# Patient Record
Sex: Female | Born: 1955 | Race: White | Hispanic: No | Marital: Married | Smoking: Former smoker
Health system: Southern US, Community
[De-identification: ages and names within clinical notes are randomized; demographics above are authoritative.]

## PROBLEM LIST (undated history)

## (undated) DIAGNOSIS — F329 Major depressive disorder, single episode, unspecified: Secondary | ICD-10-CM

## (undated) DIAGNOSIS — T7840XA Allergy, unspecified, initial encounter: Secondary | ICD-10-CM

## (undated) DIAGNOSIS — F32A Depression, unspecified: Secondary | ICD-10-CM

## (undated) DIAGNOSIS — I10 Essential (primary) hypertension: Secondary | ICD-10-CM

## (undated) DIAGNOSIS — E785 Hyperlipidemia, unspecified: Secondary | ICD-10-CM

## (undated) HISTORY — DX: Major depressive disorder, single episode, unspecified: F32.9

## (undated) HISTORY — PX: NASAL SINUS SURGERY: SHX719

## (undated) HISTORY — PX: TONSILLECTOMY: SUR1361

## (undated) HISTORY — PX: BREAST LUMPECTOMY: SHX2

## (undated) HISTORY — DX: Essential (primary) hypertension: I10

## (undated) HISTORY — DX: Hyperlipidemia, unspecified: E78.5

## (undated) HISTORY — DX: Depression, unspecified: F32.A

## (undated) HISTORY — DX: Allergy, unspecified, initial encounter: T78.40XA

---

## 1998-05-14 ENCOUNTER — Ambulatory Visit (HOSPITAL_COMMUNITY): Admission: RE | Admit: 1998-05-14 | Discharge: 1998-05-14 | Payer: Self-pay | Admitting: Otolaryngology

## 1999-06-19 ENCOUNTER — Ambulatory Visit (HOSPITAL_COMMUNITY): Admission: RE | Admit: 1999-06-19 | Discharge: 1999-06-19 | Payer: Self-pay | Admitting: Otolaryngology

## 2002-07-06 ENCOUNTER — Other Ambulatory Visit: Admission: RE | Admit: 2002-07-06 | Discharge: 2002-07-06 | Payer: Self-pay | Admitting: Internal Medicine

## 2003-09-05 ENCOUNTER — Other Ambulatory Visit: Admission: RE | Admit: 2003-09-05 | Discharge: 2003-09-05 | Payer: Self-pay | Admitting: Internal Medicine

## 2004-09-01 ENCOUNTER — Other Ambulatory Visit: Admission: RE | Admit: 2004-09-01 | Discharge: 2004-09-01 | Payer: Self-pay | Admitting: Internal Medicine

## 2004-10-29 ENCOUNTER — Encounter: Admission: RE | Admit: 2004-10-29 | Discharge: 2004-10-29 | Payer: Self-pay | Admitting: General Surgery

## 2004-10-29 ENCOUNTER — Encounter (INDEPENDENT_AMBULATORY_CARE_PROVIDER_SITE_OTHER): Payer: Self-pay | Admitting: *Deleted

## 2004-12-18 ENCOUNTER — Ambulatory Visit (HOSPITAL_BASED_OUTPATIENT_CLINIC_OR_DEPARTMENT_OTHER): Admission: RE | Admit: 2004-12-18 | Discharge: 2004-12-18 | Payer: Self-pay | Admitting: General Surgery

## 2004-12-18 ENCOUNTER — Encounter (INDEPENDENT_AMBULATORY_CARE_PROVIDER_SITE_OTHER): Payer: Self-pay | Admitting: Specialist

## 2004-12-18 ENCOUNTER — Ambulatory Visit (HOSPITAL_COMMUNITY): Admission: RE | Admit: 2004-12-18 | Discharge: 2004-12-18 | Payer: Self-pay | Admitting: General Surgery

## 2005-01-28 ENCOUNTER — Ambulatory Visit: Payer: Self-pay | Admitting: Internal Medicine

## 2005-06-21 ENCOUNTER — Ambulatory Visit: Payer: Self-pay | Admitting: Internal Medicine

## 2005-10-19 ENCOUNTER — Ambulatory Visit: Payer: Self-pay | Admitting: Internal Medicine

## 2005-10-28 ENCOUNTER — Other Ambulatory Visit: Admission: RE | Admit: 2005-10-28 | Discharge: 2005-10-28 | Payer: Self-pay | Admitting: Neurosurgery

## 2005-10-28 ENCOUNTER — Encounter: Payer: Self-pay | Admitting: Internal Medicine

## 2005-10-28 ENCOUNTER — Ambulatory Visit: Payer: Self-pay | Admitting: Internal Medicine

## 2005-12-20 ENCOUNTER — Ambulatory Visit: Payer: Self-pay | Admitting: Internal Medicine

## 2006-01-20 ENCOUNTER — Ambulatory Visit: Payer: Self-pay | Admitting: Internal Medicine

## 2006-02-24 ENCOUNTER — Ambulatory Visit: Payer: Self-pay | Admitting: Internal Medicine

## 2006-11-15 ENCOUNTER — Ambulatory Visit: Payer: Self-pay | Admitting: Internal Medicine

## 2006-12-21 ENCOUNTER — Ambulatory Visit: Payer: Self-pay | Admitting: Internal Medicine

## 2007-02-02 ENCOUNTER — Ambulatory Visit: Payer: Self-pay | Admitting: Internal Medicine

## 2007-02-02 LAB — CONVERTED CEMR LAB
ALT: 34 units/L (ref 0–40)
AST: 25 units/L (ref 0–37)
Albumin: 3.5 g/dL (ref 3.5–5.2)
Alkaline Phosphatase: 60 units/L (ref 39–117)
BUN: 7 mg/dL (ref 6–23)
Basophils Absolute: 0 10*3/uL (ref 0.0–0.1)
Basophils Relative: 0.1 % (ref 0.0–1.0)
Bilirubin, Direct: 0.1 mg/dL (ref 0.0–0.3)
CO2: 26 meq/L (ref 19–32)
Calcium: 9.2 mg/dL (ref 8.4–10.5)
Chloride: 107 meq/L (ref 96–112)
Cholesterol: 207 mg/dL (ref 0–200)
Creatinine, Ser: 0.8 mg/dL (ref 0.4–1.2)
Direct LDL: 105.1 mg/dL
Eosinophils Absolute: 0.3 10*3/uL (ref 0.0–0.6)
Eosinophils Relative: 4.2 % (ref 0.0–5.0)
GFR calc Af Amer: 98 mL/min
GFR calc non Af Amer: 81 mL/min
Glucose, Bld: 102 mg/dL — ABNORMAL HIGH (ref 70–99)
HCT: 36.5 % (ref 36.0–46.0)
HDL: 41.6 mg/dL (ref >39.0)
Hemoglobin: 12.8 g/dL (ref 12.0–15.0)
Lymphocytes Relative: 29.8 % (ref 12.0–46.0)
MCHC: 35.2 g/dL (ref 30.0–36.0)
MCV: 92.6 fL (ref 78.0–100.0)
Monocytes Absolute: 0.4 10*3/uL (ref 0.2–0.7)
Monocytes Relative: 6.6 % (ref 3.0–11.0)
Neutro Abs: 3.9 10*3/uL (ref 1.4–7.7)
Neutrophils Relative %: 59.3 % (ref 43.0–77.0)
Platelets: 411 10*3/uL — ABNORMAL HIGH (ref 150–400)
Potassium: 4.2 meq/L (ref 3.5–5.1)
RBC: 3.94 M/uL (ref 3.87–5.11)
RDW: 11.7 % (ref 11.5–14.6)
Sodium: 141 meq/L (ref 135–145)
TSH: 1 microintl units/mL (ref 0.35–5.50)
Total Bilirubin: 0.6 mg/dL (ref 0.3–1.2)
Total CHOL/HDL Ratio: 5
Total Protein: 7.1 g/dL (ref 6.0–8.3)
Triglycerides: 339 mg/dL (ref 0–149)
VLDL: 68 mg/dL — ABNORMAL HIGH (ref 0–40)
WBC: 6.6 10*3/uL (ref 4.5–10.5)

## 2007-02-07 ENCOUNTER — Encounter: Payer: Self-pay | Admitting: Internal Medicine

## 2007-02-07 ENCOUNTER — Other Ambulatory Visit: Admission: RE | Admit: 2007-02-07 | Discharge: 2007-02-07 | Payer: Self-pay | Admitting: Internal Medicine

## 2007-02-07 ENCOUNTER — Ambulatory Visit: Payer: Self-pay | Admitting: Internal Medicine

## 2007-05-24 ENCOUNTER — Ambulatory Visit: Payer: Self-pay | Admitting: Internal Medicine

## 2007-05-24 LAB — CONVERTED CEMR LAB
Cholesterol: 236 mg/dL (ref 0–200)
HDL: 72.9 mg/dL (ref >39.0)
Total CHOL/HDL Ratio: 3.2
VLDL: 32 mg/dL (ref 0–40)

## 2007-05-31 ENCOUNTER — Ambulatory Visit: Payer: Self-pay | Admitting: Internal Medicine

## 2007-09-29 DIAGNOSIS — J309 Allergic rhinitis, unspecified: Secondary | ICD-10-CM | POA: Insufficient documentation

## 2007-09-29 DIAGNOSIS — F329 Major depressive disorder, single episode, unspecified: Secondary | ICD-10-CM

## 2007-09-29 DIAGNOSIS — E785 Hyperlipidemia, unspecified: Secondary | ICD-10-CM

## 2007-09-29 DIAGNOSIS — I10 Essential (primary) hypertension: Secondary | ICD-10-CM

## 2008-02-14 ENCOUNTER — Encounter: Payer: Self-pay | Admitting: Internal Medicine

## 2008-03-06 ENCOUNTER — Ambulatory Visit: Payer: Self-pay | Admitting: Internal Medicine

## 2008-03-06 LAB — CONVERTED CEMR LAB
ALT: 28 units/L (ref 0–35)
AST: 28 units/L (ref 0–37)
Albumin: 3.9 g/dL (ref 3.5–5.2)
Alkaline Phosphatase: 63 units/L (ref 39–117)
BUN: 12 mg/dL (ref 6–23)
Basophils Absolute: 0.1 10*3/uL (ref 0.0–0.1)
Basophils Relative: 0.9 % (ref 0.0–1.0)
Bilirubin Urine: NEGATIVE
Bilirubin, Direct: 0.1 mg/dL (ref 0.0–0.3)
CO2: 27 meq/L (ref 19–32)
Calcium: 9.2 mg/dL (ref 8.4–10.5)
Chloride: 107 meq/L (ref 96–112)
Cholesterol: 206 mg/dL (ref 0–200)
Creatinine, Ser: 0.8 mg/dL (ref 0.4–1.2)
Direct LDL: 113.2 mg/dL
Eosinophils Absolute: 0.3 10*3/uL (ref 0.0–0.7)
Eosinophils Relative: 4.5 % (ref 0.0–5.0)
GFR calc Af Amer: 97 mL/min
GFR calc non Af Amer: 80 mL/min
Glucose, Bld: 112 mg/dL — ABNORMAL HIGH (ref 70–99)
Glucose, Urine, Semiquant: NEGATIVE
HCT: 37.7 % (ref 36.0–46.0)
HDL: 77.2 mg/dL (ref >39.0)
Hemoglobin: 12.8 g/dL (ref 12.0–15.0)
Ketones, urine, test strip: NEGATIVE
Lymphocytes Relative: 24.3 % (ref 12.0–46.0)
MCHC: 34.1 g/dL (ref 30.0–36.0)
MCV: 93 fL (ref 78.0–100.0)
Monocytes Absolute: 0.5 10*3/uL (ref 0.1–1.0)
Monocytes Relative: 7.6 % (ref 3.0–12.0)
Neutro Abs: 4.2 10*3/uL (ref 1.4–7.7)
Neutrophils Relative %: 62.7 % (ref 43.0–77.0)
Nitrite: NEGATIVE
Platelets: 298 10*3/uL (ref 150–400)
Potassium: 4.3 meq/L (ref 3.5–5.1)
RBC: 4.05 M/uL (ref 3.87–5.11)
RDW: 11.9 % (ref 11.5–14.6)
Sodium: 139 meq/L (ref 135–145)
Specific Gravity, Urine: 1.015
TSH: 1.51 microintl units/mL (ref 0.35–5.50)
Total Bilirubin: 0.7 mg/dL (ref 0.3–1.2)
Total CHOL/HDL Ratio: 2.7
Total Protein: 6.8 g/dL (ref 6.0–8.3)
Triglycerides: 92 mg/dL (ref 0–149)
Urobilinogen, UA: 0.2
VLDL: 18 mg/dL (ref 0–40)
WBC Urine, dipstick: NEGATIVE
WBC: 6.8 10*3/uL (ref 4.5–10.5)
pH: 8.5

## 2008-03-18 ENCOUNTER — Ambulatory Visit: Payer: Self-pay | Admitting: Internal Medicine

## 2008-04-11 ENCOUNTER — Other Ambulatory Visit: Admission: RE | Admit: 2008-04-11 | Discharge: 2008-04-11 | Payer: Self-pay | Admitting: Internal Medicine

## 2008-04-11 ENCOUNTER — Encounter: Payer: Self-pay | Admitting: Internal Medicine

## 2008-04-11 ENCOUNTER — Ambulatory Visit: Payer: Self-pay | Admitting: Internal Medicine

## 2008-10-11 ENCOUNTER — Ambulatory Visit: Payer: Self-pay | Admitting: Internal Medicine

## 2009-01-28 ENCOUNTER — Telehealth: Payer: Self-pay | Admitting: Internal Medicine

## 2009-04-14 ENCOUNTER — Ambulatory Visit: Payer: Self-pay | Admitting: Internal Medicine

## 2009-04-14 LAB — CONVERTED CEMR LAB
ALT: 24 units/L (ref 0–35)
AST: 27 units/L (ref 0–37)
Albumin: 3.9 g/dL (ref 3.5–5.2)
Alkaline Phosphatase: 67 units/L (ref 39–117)
BUN: 13 mg/dL (ref 6–23)
Basophils Absolute: 0.1 10*3/uL (ref 0.0–0.1)
Basophils Relative: 1.5 % (ref 0.0–3.0)
Bilirubin Urine: NEGATIVE
Bilirubin, Direct: 0 mg/dL (ref 0.0–0.3)
CO2: 26 meq/L (ref 19–32)
Calcium: 8.9 mg/dL (ref 8.4–10.5)
Chloride: 111 meq/L (ref 96–112)
Cholesterol: 218 mg/dL — ABNORMAL HIGH (ref 0–200)
Creatinine, Ser: 0.8 mg/dL (ref 0.4–1.2)
Direct LDL: 146.7 mg/dL
Eosinophils Absolute: 0.4 10*3/uL (ref 0.0–0.7)
Eosinophils Relative: 6.7 % — ABNORMAL HIGH (ref 0.0–5.0)
GFR calc non Af Amer: 79.77 mL/min (ref >60)
Glucose, Bld: 112 mg/dL — ABNORMAL HIGH (ref 70–99)
Glucose, Urine, Semiquant: NEGATIVE
HCT: 37.7 % (ref 36.0–46.0)
HDL: 59.6 mg/dL (ref >39.00)
Hemoglobin: 13 g/dL (ref 12.0–15.0)
Ketones, urine, test strip: NEGATIVE
Lymphocytes Relative: 34.5 % (ref 12.0–46.0)
Lymphs Abs: 1.9 10*3/uL (ref 0.7–4.0)
MCHC: 34.6 g/dL (ref 30.0–36.0)
MCV: 94.7 fL (ref 78.0–100.0)
Monocytes Absolute: 0.3 10*3/uL (ref 0.1–1.0)
Monocytes Relative: 6.2 % (ref 3.0–12.0)
Neutro Abs: 2.8 10*3/uL (ref 1.4–7.7)
Neutrophils Relative %: 51.1 % (ref 43.0–77.0)
Nitrite: NEGATIVE
Platelets: 275 10*3/uL (ref 150.0–400.0)
Potassium: 3.9 meq/L (ref 3.5–5.1)
RBC: 3.98 M/uL (ref 3.87–5.11)
RDW: 12.1 % (ref 11.5–14.6)
Sodium: 142 meq/L (ref 135–145)
Specific Gravity, Urine: 1.015
TSH: 1.8 microintl units/mL (ref 0.35–5.50)
Total Bilirubin: 0.6 mg/dL (ref 0.3–1.2)
Total CHOL/HDL Ratio: 4
Total Protein: 7 g/dL (ref 6.0–8.3)
Triglycerides: 83 mg/dL (ref 0.0–149.0)
Urobilinogen, UA: 0.2
VLDL: 16.6 mg/dL (ref 0.0–40.0)
WBC Urine, dipstick: NEGATIVE
WBC: 5.5 10*3/uL (ref 4.5–10.5)
pH: 7

## 2009-05-16 ENCOUNTER — Other Ambulatory Visit: Admission: RE | Admit: 2009-05-16 | Discharge: 2009-05-16 | Payer: Self-pay | Admitting: Internal Medicine

## 2009-05-16 ENCOUNTER — Ambulatory Visit: Payer: Self-pay | Admitting: Internal Medicine

## 2009-05-16 ENCOUNTER — Ambulatory Visit (HOSPITAL_COMMUNITY): Admission: RE | Admit: 2009-05-16 | Discharge: 2009-05-16 | Payer: Self-pay | Admitting: Internal Medicine

## 2009-05-16 ENCOUNTER — Encounter: Payer: Self-pay | Admitting: Internal Medicine

## 2009-05-16 DIAGNOSIS — M79609 Pain in unspecified limb: Secondary | ICD-10-CM | POA: Insufficient documentation

## 2009-05-26 ENCOUNTER — Telehealth: Payer: Self-pay | Admitting: Internal Medicine

## 2009-11-05 ENCOUNTER — Encounter (INDEPENDENT_AMBULATORY_CARE_PROVIDER_SITE_OTHER): Payer: Self-pay | Admitting: *Deleted

## 2009-12-15 ENCOUNTER — Telehealth: Payer: Self-pay | Admitting: Internal Medicine

## 2010-01-06 ENCOUNTER — Telehealth: Payer: Self-pay | Admitting: Internal Medicine

## 2010-02-18 ENCOUNTER — Ambulatory Visit: Payer: Self-pay | Admitting: Internal Medicine

## 2010-02-18 LAB — CONVERTED CEMR LAB
ALT: 23 units/L (ref 0–35)
AST: 21 units/L (ref 0–37)
Albumin: 4 g/dL (ref 3.5–5.2)
Alkaline Phosphatase: 72 units/L (ref 39–117)
Bilirubin, Direct: 0.1 mg/dL (ref 0.0–0.3)
Cholesterol: 214 mg/dL — ABNORMAL HIGH (ref 0–200)
Direct LDL: 140.5 mg/dL
HDL: 67.8 mg/dL (ref >39.00)
Total Bilirubin: 0.4 mg/dL (ref 0.3–1.2)
Total CHOL/HDL Ratio: 3
Total Protein: 7.1 g/dL (ref 6.0–8.3)
Triglycerides: 81 mg/dL (ref 0.0–149.0)
VLDL: 16.2 mg/dL (ref 0.0–40.0)

## 2010-02-27 ENCOUNTER — Ambulatory Visit: Payer: Self-pay | Admitting: Internal Medicine

## 2010-02-27 DIAGNOSIS — E669 Obesity, unspecified: Secondary | ICD-10-CM

## 2010-05-26 ENCOUNTER — Telehealth: Payer: Self-pay | Admitting: Internal Medicine

## 2010-06-02 ENCOUNTER — Ambulatory Visit: Payer: Self-pay | Admitting: Family Medicine

## 2010-06-02 DIAGNOSIS — H6690 Otitis media, unspecified, unspecified ear: Secondary | ICD-10-CM | POA: Insufficient documentation

## 2010-06-02 DIAGNOSIS — J0100 Acute maxillary sinusitis, unspecified: Secondary | ICD-10-CM

## 2010-06-03 ENCOUNTER — Telehealth: Payer: Self-pay | Admitting: Family Medicine

## 2010-06-08 ENCOUNTER — Ambulatory Visit: Payer: Self-pay | Admitting: Internal Medicine

## 2010-06-08 LAB — CONVERTED CEMR LAB
ALT: 20 units/L (ref 0–35)
AST: 22 units/L (ref 0–37)
Albumin: 4.1 g/dL (ref 3.5–5.2)
Alkaline Phosphatase: 80 units/L (ref 39–117)
BUN: 15 mg/dL (ref 6–23)
Basophils Absolute: 0.1 10*3/uL (ref 0.0–0.1)
Basophils Relative: 0.7 % (ref 0.0–3.0)
Bilirubin Urine: NEGATIVE
Bilirubin, Direct: 0.1 mg/dL (ref 0.0–0.3)
CO2: 26 meq/L (ref 19–32)
Calcium: 8.9 mg/dL (ref 8.4–10.5)
Chloride: 108 meq/L (ref 96–112)
Cholesterol: 217 mg/dL — ABNORMAL HIGH (ref 0–200)
Creatinine, Ser: 0.9 mg/dL (ref 0.4–1.2)
Direct LDL: 118.2 mg/dL
Eosinophils Absolute: 0.4 10*3/uL (ref 0.0–0.7)
Eosinophils Relative: 5.2 % — ABNORMAL HIGH (ref 0.0–5.0)
GFR calc non Af Amer: 74.06 mL/min (ref >60)
Glucose, Bld: 92 mg/dL (ref 70–99)
Glucose, Urine, Semiquant: NEGATIVE
HCT: 36 % (ref 36.0–46.0)
HDL: 65.6 mg/dL (ref >39.00)
Hemoglobin: 12.8 g/dL (ref 12.0–15.0)
Ketones, urine, test strip: NEGATIVE
Lymphocytes Relative: 28.5 % (ref 12.0–46.0)
Lymphs Abs: 2.2 10*3/uL (ref 0.7–4.0)
MCHC: 35.5 g/dL (ref 30.0–36.0)
MCV: 93.5 fL (ref 78.0–100.0)
Monocytes Absolute: 0.6 10*3/uL (ref 0.1–1.0)
Monocytes Relative: 7.8 % (ref 3.0–12.0)
Neutro Abs: 4.4 10*3/uL (ref 1.4–7.7)
Neutrophils Relative %: 57.8 % (ref 43.0–77.0)
Nitrite: NEGATIVE
Platelets: 291 10*3/uL (ref 150.0–400.0)
Potassium: 4.6 meq/L (ref 3.5–5.1)
Protein, U semiquant: NEGATIVE
RBC: 3.85 M/uL — ABNORMAL LOW (ref 3.87–5.11)
RDW: 13 % (ref 11.5–14.6)
Sodium: 141 meq/L (ref 135–145)
Specific Gravity, Urine: >=1.03
TSH: 2.55 microintl units/mL (ref 0.35–5.50)
Total Bilirubin: 0.5 mg/dL (ref 0.3–1.2)
Total CHOL/HDL Ratio: 3
Total Protein: 6.9 g/dL (ref 6.0–8.3)
Triglycerides: 198 mg/dL — ABNORMAL HIGH (ref 0.0–149.0)
Urobilinogen, UA: 0.2
VLDL: 39.6 mg/dL (ref 0.0–40.0)
WBC Urine, dipstick: NEGATIVE
WBC: 7.6 10*3/uL (ref 4.5–10.5)
pH: 5.5

## 2010-06-17 ENCOUNTER — Other Ambulatory Visit: Admission: RE | Admit: 2010-06-17 | Discharge: 2010-06-17 | Payer: Self-pay | Admitting: Internal Medicine

## 2010-06-17 ENCOUNTER — Ambulatory Visit: Payer: Self-pay | Admitting: Internal Medicine

## 2010-06-17 LAB — CONVERTED CEMR LAB: Pap Smear: NORMAL

## 2010-06-23 LAB — CONVERTED CEMR LAB: Pap Smear: NEGATIVE

## 2010-10-26 ENCOUNTER — Telehealth: Payer: Self-pay | Admitting: Internal Medicine

## 2010-12-16 ENCOUNTER — Ambulatory Visit
Admission: RE | Admit: 2010-12-16 | Discharge: 2010-12-16 | Payer: Self-pay | Source: Home / Self Care | Attending: Internal Medicine | Admitting: Internal Medicine

## 2010-12-27 LAB — CONVERTED CEMR LAB
Cholesterol, target level: 200 mg/dL
HDL goal, serum: 40 mg/dL
LDL Goal: 160 mg/dL

## 2010-12-31 NOTE — Progress Notes (Signed)
Summary: Zyrtec D  Phone Note Call from Patient   Caller: Patient Call For: Stacie Glaze MD Summary of Call: Pt would like a refill on Zyrtec D, please. [facility] [address]) Initial call taken by: Lynann Beaver CMA,  January 06, 2010 12:34 PM    Prescriptions: ZYRTEC-D ALLERGY & CONGESTION 5-120 MG XR12H-TAB (CETIRIZINE-PSEUDOEPHEDRINE) 1 once daily  #30 x 0   Entered by:   Willy Eddy, LPN   Authorized by:   Stacie Glaze MD   Signed by:   Willy Eddy, LPN on 21/30/8657   Method used:   Electronically to        [facility] [phone]* (retail)       [address]       [place], [place]  [address]       Ph: [phone] or [phone]       Fax: [phone]   RxID:   [id number]

## 2010-12-31 NOTE — Progress Notes (Signed)
Summary: sinus infection  Phone Note Call from Patient Call back at Home Phone [phone]   Caller: vm Call For: Taylor Morales Summary of Call: Sinus infection.  Cold several weeks ago.  Using saline, humidity, OTC everything for several weeks.  Not better.  [facility]  [address]  [phone] Initial call taken by: Rudy Jew, RN,  December 15, 2009 2:41 PM  Follow-up for Phone Call        per dr Lovell Sheehan -septra ds two times a day for 10 days and zyrtec d once daily pt informed and med sent in Follow-up by: Willy Eddy, LPN,  December 15, 2009 5:44 PM    New/Updated Medications: SEPTRA DS 800-160 MG TABS (SULFAMETHOXAZOLE-TRIMETHOPRIM) 1 two times a day for 10 days ZYRTEC-D ALLERGY & CONGESTION 5-120 MG XR12H-TAB (CETIRIZINE-PSEUDOEPHEDRINE) 1 once daily Prescriptions: ZYRTEC-D ALLERGY & CONGESTION 5-120 MG XR12H-TAB (CETIRIZINE-PSEUDOEPHEDRINE) 1 once daily  #10 x 0   Entered by:   Willy Eddy, LPN   Authorized by:   Stacie Glaze MD   Signed by:   Willy Eddy, LPN on 47/82/9562   Method used:   Electronically to        [facility] [phone]* (retail)       [address]       [place], [place]  [address]       Ph: [phone] or [phone]       Fax: [phone]   RxID:   [id number] SEPTRA DS 800-160 MG TABS (SULFAMETHOXAZOLE-TRIMETHOPRIM) 1 two times a day for 10 days  #20 x 0   Entered by:   Willy Eddy, LPN   Authorized by:   Stacie Glaze MD   Signed by:   Willy Eddy, LPN on 51/88/4166   Method used:   Electronically to        [facility] [phone]* (retail)       [address]       [place], [place]  [address]       Ph: [phone] or [phone]       Fax: [phone]   RxID:   [id number]

## 2010-12-31 NOTE — Assessment & Plan Note (Signed)
Summary: right ear stopped up/dm   Vital Signs:  Morales profile:   Taylor year old female O2 Sat:      97 % Temp:     98.4 degrees F Pulse rate:   98 / minute BP sitting:   114 / 76  (left arm) Cuff size:   large  Vitals Entered By: Pura Spice, RN (June 02, 2010 3:41 PM) CC: head congestion  z pk completed .. rt ear stopped  up    Contraindications/Deferment of Procedures/Staging:    Test/Procedure: Weight Refused    Reason for deferment: Morales declined-cannot calculate BMI   History of Present Illness: This Taylor Morales of Dr. Lovell Sheehan was seen with complaint of head congestion over the past week and had been given a Z-Pak or possible sinusitis but has not improved. Her symptoms of facial pressure and pain in the right ear and increased in severity and she is in for treatment Past history hypertension but blood pressure 114/76  Allergies: No Known Drug Allergies  Past History:  Past Medical History: Last updated: 09/29/2007 Allergic rhinitis Depression Hyperlipidemia Hypertension  Social History: Last updated: 03/18/2008 Occupation: Married Never Smoked  Risk Factors: Smoking Status: never (02/27/2010)  Review of Systems      See HPI  The Morales denies anorexia, fever, weight loss, weight gain, vision loss, decreased hearing, hoarseness, chest pain, syncope, dyspnea on exertion, peripheral edema, prolonged cough, headaches, hemoptysis, abdominal pain, melena, hematochezia, severe indigestion/heartburn, hematuria, incontinence, genital sores, muscle weakness, suspicious skin lesions, transient blindness, difficulty walking, depression, unusual weight change, abnormal bleeding, enlarged lymph nodes, angioedema, breast masses, and testicular masses.    Physical Exam  General:  Well-developed,well-nourished,in no acute distress; alert,appropriate and cooperative throughout examination Head:  Normocephalic and atraumatic without obvious  abnormalities. No apparent alopecia or balding. Eyes:  No corneal or conjunctival inflammation noted. EOMI. Perrla. Funduscopic exam benign, without hemorrhages, exudates or papilledema. Vision grossly normal. Ears:  rt TM erythematous full, some cerumenLeft ear neg Nose:  nasl congestion tendeness rt maxillary sinus Mouth:  minima; erythema Neck:  muscle spasm rt trapezius Lungs:  Normal respiratory effort, chest expands symmetrically. Lungs are clear to auscultation, no crackles or wheezes. Heart:  Normal rate and regular rhythm. S1 and S2 normal without gallop, murmur, click, rub or other extra sounds.   Impression & Recommendations:  Problem # 1:  ACUTE MAXILLARY SINUSITIS (ICD-461.0) Assessment New  Her updated medication list for this problem includes:    Zyrtec-d Allergy & Congestion 5-120 Mg Xr12h-tab (Cetirizine-pseudoephedrine) .Marland Kitchen... 1 once daily    Zithromax Z-pak 250 Mg Tabs (Azithromycin) .Marland Kitchen... As directed    Levaquin 500 Mg Tabs (Levofloxacin) .Marland Kitchen... 1  once daily for infection    Avelox 400 Mg Tabs (Moxifloxacin hcl) .Marland Kitchen... 1 tablet by mouth daily take with main meal  Problem # 2:  OTITIS MEDIA, RIGHT (ICD-382.9) Assessment: New  Her updated medication list for this problem includes:    Zithromax Z-pak 250 Mg Tabs (Azithromycin) .Marland Kitchen... As directed    Levaquin 500 Mg Tabs (Levofloxacin) .Marland Kitchen... 1  once daily for infection    Avelox 400 Mg Tabs (Moxifloxacin hcl) .Marland Kitchen... 1 tablet by mouth daily take with main meal  Problem # 3:  HYPERTENSION (ICD-401.9) Assessment: Improved  Her updated medication list for this problem includes:    Amlodipine Besy-benazepril Hcl 2.5-10 Mg Caps (Amlodipine besy-benazepril hcl) ..... One by mouth daily    Hydrochlorothiazide 12.5 Mg Caps (Hydrochlorothiazide) ..... Once daily  Complete Medication  List: 1)  Amlodipine Besy-benazepril Hcl 2.5-10 Mg Caps (Amlodipine besy-benazepril hcl) .... One by mouth daily 2)  Hydrochlorothiazide 12.5 Mg  Caps (Hydrochlorothiazide) .... Once daily 3)  Fluoxetine Hcl 40 Mg Caps (Fluoxetine hcl) .... Once daily 4)  Valtrex 500 Mg Tabs (Valacyclovir hcl) .Marland Kitchen.. 1 once daily as needed 5)  Zyrtec-d Allergy & Congestion 5-120 Mg Xr12h-tab (Cetirizine-pseudoephedrine) .Marland Kitchen.. 1 once daily 6)  Krill Oil 1000 Mg Caps (Krill oil) .... Two by mouth two times a day 7)  Zithromax Z-pak 250 Mg Tabs (Azithromycin) .... As directed 8)  Levaquin 500 Mg Tabs (Levofloxacin) .Marland Kitchen.. 1  once daily for infection 9)  Hydrocodone-acetaminophen 10-650 Mg Tabs (Hydrocodone-acetaminophen) .Marland Kitchen.. 1 q4h as needed pain 10)  Avelox 400 Mg Tabs (Moxifloxacin hcl) .Marland Kitchen.. 1 tablet by mouth daily take with main meal  Morales Instructions: 1)  oTITIS MEDIA RT EAR 2)  mAXILLARY SINUSITIS 3)  lEVAQUIN 500 MG EACH DASY FOR 10 DAYS 4)  OMNARIS 2 SPRAYS EACH NOSTIL  EACH DAY Prescriptions: AVELOX 400 MG  TABS (MOXIFLOXACIN HCL) 1 tablet by mouth daily take with main meal  #7 x 0   Entered and Authorized by:   Judithann Sheen MD   Signed by:   Judithann Sheen MD on 06/07/2010   Method used:   Telephoned to ...       [facility] [phone]* (retail)       [address]       [place], [place]  [address]       Ph: [phone] or [phone]       Fax: [phone]   RxID:   [id number] HYDROCODONE-ACETAMINOPHEN 10-650 MG TABS (HYDROCODONE-ACETAMINOPHEN) 1 Q4H as needed PAIN  #50 x 2   Entered and Authorized by:   Judithann Sheen MD   Signed by:   Judithann Sheen MD on 06/02/2010   Method used:   Print then Give to Morales   RxID:   [id number] LEVAQUIN 500 MG TABS (LEVOFLOXACIN) 1  once daily FOR INFECTION  #10 x 0   Entered and Authorized by:   Judithann Sheen MD   Signed by:   Judithann Sheen MD on 06/02/2010   Method used:   Electronically to        [facility] [phone]* (retail)       [address]       [place], [place]  [address]       Ph: [phone] or [phone]       Fax: [phone]   RxID:    [id number]

## 2010-12-31 NOTE — Assessment & Plan Note (Signed)
Summary: DELAYED 3 MTH F/U // RS   Vital Signs:  Patient profile:   55 year old female Height:      67 inches Weight:      222 pounds BMI:     34.90 Temp:     98.1 degrees F oral Pulse rate:   80 / minute Resp:     14 per minute BP sitting:   150 / 90  (left arm)  Vitals Entered By: Willy Eddy, LPN (February 27, 9146 9:12 AM)  Nutrition Counseling: Patient's BMI is greater than 25 and therefore counseled on weight management options. CC: roa-labs- pt only taking prescription meds-forget otc meds, Lipid Management   CC:  roa-labs- pt only taking prescription meds-forget otc meds and Lipid Management.  History of Present Illness: follow up lipids has not  be adherent has  not been of the kril oil    Lipid Management History:      Positive NCEP/ATP III risk factors include hypertension.  Negative NCEP/ATP III risk factors include female age less than 45 years old, HDL cholesterol greater than 60, non-tobacco-user status, no ASHD (atherosclerotic heart disease), no prior stroke/TIA, no peripheral vascular disease, and no history of aortic aneurysm.     Preventive Screening-Counseling & Management  Alcohol-Tobacco     Smoking Status: never  Problems Prior to Update: 1)  Foot Pain, Right  (ICD-729.5) 2)  Screening For Malignant Neoplasm of The Cervix  (ICD-V76.2) 3)  Physical Examination  (ICD-V70.0) 4)  Hypertension  (ICD-401.9) 5)  Hyperlipidemia  (ICD-272.4) 6)  Depression  (ICD-311) 7)  Allergic Rhinitis  (ICD-477.9)  Current Problems (verified): 1)  Foot Pain, Right  (ICD-729.5) 2)  Screening For Malignant Neoplasm of The Cervix  (ICD-V76.2) 3)  Physical Examination  (ICD-V70.0) 4)  Hypertension  (ICD-401.9) 5)  Hyperlipidemia  (ICD-272.4) 6)  Depression  (ICD-311) 7)  Allergic Rhinitis  (ICD-477.9)  Medications Prior to Update: 1)  Amlodipine Besy-Benazepril Hcl 2.5-10 Mg Caps (Amlodipine Besy-Benazepril Hcl) .... One By Mouth Daily 2)  Hydrochlorothiazide  12.5 Mg  Caps (Hydrochlorothiazide) .... Once Daily 3)  Calcium 500/d 500-200 Mg-Unit  Tabs (Calcium Carbonate-Vitamin D) .... Once Daily 4)  Gnp Flax Seed Oil 1000 Mg  Caps (Flaxseed (Linseed)) .... Once Daily 5)  Fluoxetine Hcl 40 Mg  Caps (Fluoxetine Hcl) .... Once Daily 6)  Valtrex 500 Mg Tabs (Valacyclovir Hcl) .Marland Kitchen.. 1 Once Daily As Needed 7)  Krill Oil 1000 Mg Caps (Krill Oil) .... Two By Mouth Two Times A Day 8)  Septra Ds 800-160 Mg Tabs (Sulfamethoxazole-Trimethoprim) .Marland Kitchen.. 1 Two Times A Day For 10 Days 9)  Zyrtec-D Allergy & Congestion 5-120 Mg Xr12h-Tab (Cetirizine-Pseudoephedrine) .Marland Kitchen.. 1 Once Daily  Current Medications (verified): 1)  Amlodipine Besy-Benazepril Hcl 2.5-10 Mg Caps (Amlodipine Besy-Benazepril Hcl) .... One By Mouth Daily 2)  Hydrochlorothiazide 12.5 Mg  Caps (Hydrochlorothiazide) .... Once Daily 3)  Fluoxetine Hcl 40 Mg  Caps (Fluoxetine Hcl) .... Once Daily 4)  Valtrex 500 Mg Tabs (Valacyclovir Hcl) .Marland Kitchen.. 1 Once Daily As Needed 5)  Zyrtec-D Allergy & Congestion 5-120 Mg Xr12h-Tab (Cetirizine-Pseudoephedrine) .Marland Kitchen.. 1 Once Daily  Allergies (verified): No Known Drug Allergies  Past History:  Social History: Last updated: 03/18/2008 Occupation: Married Never Smoked  Risk Factors: Smoking Status: never (02/27/2010)  Past medical, surgical, family and social histories (including risk factors) reviewed, and no changes noted (except as noted below).  Past Medical History: Reviewed history from 09/29/2007 and no changes required. Allergic rhinitis Depression Hyperlipidemia Hypertension  Family History: Reviewed history and no changes required.  Social History: Reviewed history from 03/18/2008 and no changes required. Occupation: Married Never Smoked  Review of Systems  The patient denies anorexia, fever, weight loss, weight gain, vision loss, decreased hearing, hoarseness, chest pain, syncope, dyspnea on exertion, peripheral edema, prolonged cough,  headaches, hemoptysis, abdominal pain, melena, hematochezia, severe indigestion/heartburn, hematuria, incontinence, genital sores, muscle weakness, suspicious skin lesions, transient blindness, difficulty walking, depression, unusual weight change, abnormal bleeding, enlarged lymph nodes, angioedema, and breast masses.    Physical Exam  General:  Well-developed,well-nourished,in no acute distress; alert,appropriate and cooperative throughout examination Head:  Normocephalic and atraumatic without obvious abnormalities. No apparent alopecia or balding. Eyes:  No corneal or conjunctival inflammation noted. EOMI. Perrla. Funduscopic exam benign, without hemorrhages, exudates or papilledema. Vision grossly normal. Ears:  R ear normal and L ear normal.   Nose:  no external deformity and no nasal discharge.   Neck:  No deformities, masses, or tenderness noted. Lungs:  Normal respiratory effort, chest expands symmetrically. Lungs are clear to auscultation, no crackles or wheezes. Heart:  Normal rate and regular rhythm. S1 and S2 normal without gallop, murmur, click, rub or other extra sounds.   Impression & Recommendations:  Problem # 1:  HYPERTENSION (ICD-401.9)  Her updated medication list for this problem includes:    Amlodipine Besy-benazepril Hcl 2.5-10 Mg Caps (Amlodipine besy-benazepril hcl) ..... One by mouth daily    Hydrochlorothiazide 12.5 Mg Caps (Hydrochlorothiazide) ..... Once daily  BP today: 150/90 Prior BP: 130/84 (05/16/2009)  Prior 10 Yr Risk Heart Disease: Not enough information (03/18/2008)  Labs Reviewed: K+: 3.9 (04/14/2009) Creat: : 0.8 (04/14/2009)   Chol: 214 (02/18/2010)   HDL: 67.80 (02/18/2010)   LDL: DEL (03/06/2008)   TG: 81.0 (02/18/2010)  Problem # 2:  HYPERLIPIDEMIA (ICD-272.4) must take the fish oil Labs Reviewed: SGOT: 21 (02/18/2010)   SGPT: 23 (02/18/2010)  Lipid Goals: Chol Goal: 200 (03/18/2008)   HDL Goal: 40 (03/18/2008)   LDL Goal: 160  (03/18/2008)   TG Goal: 150 (03/18/2008)  Prior 10 Yr Risk Heart Disease: Not enough information (03/18/2008)   HDL:67.80 (02/18/2010), 59.60 (04/14/2009)  LDL:DEL (03/06/2008), DEL (05/24/2007)  Chol:214 (02/18/2010), 218 (04/14/2009)  Trig:81.0 (02/18/2010), 83.0 (04/14/2009)  Problem # 3:  ALLERGIC RHINITIS (ICD-477.9)  Discussed use of allergy medications and environmental measures.   Problem # 4:  OVERWEIGHT (ICD-278.02)  Ht: 67 (02/27/2010)   Wt: 222 (02/27/2010)   BMI: 34.90 (02/27/2010)  Complete Medication List: 1)  Amlodipine Besy-benazepril Hcl 2.5-10 Mg Caps (Amlodipine besy-benazepril hcl) .... One by mouth daily 2)  Hydrochlorothiazide 12.5 Mg Caps (Hydrochlorothiazide) .... Once daily 3)  Fluoxetine Hcl 40 Mg Caps (Fluoxetine hcl) .... Once daily 4)  Valtrex 500 Mg Tabs (Valacyclovir hcl) .Marland Kitchen.. 1 once daily as needed 5)  Zyrtec-d Allergy & Congestion 5-120 Mg Xr12h-tab (Cetirizine-pseudoephedrine) .Marland Kitchen.. 1 once daily 6)  Krill Oil 1000 Mg Caps (Krill oil) .... Two by mouth two times a day  Lipid Assessment/Plan:      Based on NCEP/ATP III, the patient's risk factor category is "0-1 risk factors".  The patient's lipid goals are as follows: Total cholesterol goal is 200; LDL cholesterol goal is 160; HDL cholesterol goal is 40; Triglyceride goal is 150.  Her LDL cholesterol goal has not been met.  Secondary causes for hyperlipidemia have been ruled out.  She has been counseled on adjunctive measures for lowering her cholesterol and has been provided with dietary instructions.    Patient Instructions: 1)  1. moderation: 2 glass limit!  ( 4-6 hundred cal) 2)  2. execize 3)  a third glass of wine costs 30 min of treadmil!!!!!!!!!!or the eliptical 4)  in  addition to normal exercized!!!!!!! 5)  3. kril oil only two by mouth two times a day 6)  Please schedule a follow-up appointment in 3 months.  CPX 7)  Hepatic Panel prior to visit, ICD-9:995.20 8)  Lipid Panel prior to visit,  ICD-9:272.4 Prescriptions: ZYRTEC-D ALLERGY & CONGESTION 5-120 MG XR12H-TAB (CETIRIZINE-PSEUDOEPHEDRINE) 1 once daily  #60 x 11   Entered and Authorized by:   Stacie Glaze MD   Signed by:   Stacie Glaze MD on 02/27/2010   Method used:   Print then Give to Patient   RxID:   [id number]

## 2010-12-31 NOTE — Assessment & Plan Note (Signed)
Summary: cpx--pap//ccm   Vital Signs:  Patient profile:   55 year old female Height:      67 inches Weight:      222 pounds BMI:     34.90 Temp:     98.2 degrees F oral Pulse rate:   76 / minute Resp:     14 per minute BP sitting:   132 / 84  (left arm)  Vitals Entered By: Willy Eddy, LPN (June 17, 2010 2:24 PM) CC: cpx and pap-colonoscopy ? Is Patient Diabetic? No     Last PAP Result normal   CC:  cpx and pap-colonoscopy ?Marland Kitchen  History of Present Illness: has had sinus congestion and has been treated with augmentin and z pack without relief has hearing loss saw dr Scotty Court and was trested with a third antibiotic   The pt was asked about all immunizations, health maint. services that are appropriate to their age and was given guidance on diet exercize  and weight management  PAP and womens health issues  Preventive Screening-Counseling & Management  Alcohol-Tobacco     Smoking Status: never  Problems Prior to Update: 1)  Acute Maxillary Sinusitis  (ICD-461.0) 2)  Otitis Media, Right  (ICD-382.9) 3)  Overweight  (ICD-278.02) 4)  Foot Pain, Right  (ICD-729.5) 5)  Screening For Malignant Neoplasm of The Cervix  (ICD-V76.2) 6)  Physical Examination  (ICD-V70.0) 7)  Hypertension  (ICD-401.9) 8)  Hyperlipidemia  (ICD-272.4) 9)  Depression  (ICD-311) 10)  Allergic Rhinitis  (ICD-477.9)  Current Problems (verified): 1)  Acute Maxillary Sinusitis  (ICD-461.0) 2)  Otitis Media, Right  (ICD-382.9) 3)  Overweight  (ICD-278.02) 4)  Foot Pain, Right  (ICD-729.5) 5)  Screening For Malignant Neoplasm of The Cervix  (ICD-V76.2) 6)  Physical Examination  (ICD-V70.0) 7)  Hypertension  (ICD-401.9) 8)  Hyperlipidemia  (ICD-272.4) 9)  Depression  (ICD-311) 10)  Allergic Rhinitis  (ICD-477.9)  Medications Prior to Update: 1)  Amlodipine Besy-Benazepril Hcl 2.5-10 Mg Caps (Amlodipine Besy-Benazepril Hcl) .... One By Mouth Daily 2)  Hydrochlorothiazide 12.5 Mg  Caps  (Hydrochlorothiazide) .... Once Daily 3)  Fluoxetine Hcl 40 Mg  Caps (Fluoxetine Hcl) .... Once Daily 4)  Valtrex 500 Mg Tabs (Valacyclovir Hcl) .Marland Kitchen.. 1 Once Daily As Needed 5)  Zyrtec-D Allergy & Congestion 5-120 Mg Xr12h-Tab (Cetirizine-Pseudoephedrine) .Marland Kitchen.. 1 Once Daily 6)  Krill Oil 1000 Mg Caps (Krill Oil) .... Two By Mouth Two Times A Day 7)  Zithromax Z-Pak 250 Mg Tabs (Azithromycin) .... As Directed 8)  Levaquin 500 Mg Tabs (Levofloxacin) .Marland Kitchen.. 1  Once Daily For Infection 9)  Hydrocodone-Acetaminophen 10-650 Mg Tabs (Hydrocodone-Acetaminophen) .Marland Kitchen.. 1 Q4h As Needed Pain  Current Medications (verified): 1)  Amlodipine Besy-Benazepril Hcl 2.5-10 Mg Caps (Amlodipine Besy-Benazepril Hcl) .... One By Mouth Daily 2)  Hydrochlorothiazide 12.5 Mg  Caps (Hydrochlorothiazide) .... Once Daily 3)  Fluoxetine Hcl 40 Mg  Caps (Fluoxetine Hcl) .... Once Daily 4)  Valtrex 500 Mg Tabs (Valacyclovir Hcl) .Marland Kitchen.. 1 Once Daily As Needed 5)  Zyrtec-D Allergy & Congestion 5-120 Mg Xr12h-Tab (Cetirizine-Pseudoephedrine) .Marland Kitchen.. 1 Once Daily 6)  Krill Oil 1000 Mg Caps (Krill Oil) .... Two By Mouth Two Times A Day  Allergies (verified): No Known Drug Allergies  Past History:  Social History: Last updated: 03/18/2008 Occupation: Married Never Smoked  Risk Factors: Smoking Status: never (06/17/2010)  Past medical, surgical, family and social histories (including risk factors) reviewed, and no changes noted (except as noted below).  Past Medical History: Reviewed history from  09/29/2007 and no changes required. Allergic rhinitis Depression Hyperlipidemia Hypertension  Family History: Reviewed history and no changes required.  Social History: Reviewed history from 03/18/2008 and no changes required. Occupation: Married Never Smoked  Review of Systems  The patient denies anorexia, fever, weight loss, weight gain, vision loss, decreased hearing, hoarseness, chest pain, syncope, dyspnea on  exertion, peripheral edema, prolonged cough, headaches, hemoptysis, abdominal pain, melena, hematochezia, severe indigestion/heartburn, hematuria, incontinence, genital sores, muscle weakness, suspicious skin lesions, transient blindness, difficulty walking, depression, unusual weight change, abnormal bleeding, enlarged lymph nodes, angioedema, and breast masses.    Physical Exam  General:  Well-developed,well-nourished,in no acute distress; alert,appropriate and cooperative throughout examination Head:  Normocephalic and atraumatic without obvious abnormalities. No apparent alopecia or balding. Eyes:  vision grossly intact, pupils equal, and pupils round.   Ears:  rt TM erythematous full, some cerumenLeft ear TM BUDGLING Nose:  nasl congestion tendeness rt maxillary sinus Mouth:  minima; erythema Neck:  supple and full ROM.   Chest Wall:  No deformities, masses, or tenderness noted. Breasts:  No mass, nodules, thickening, tenderness, bulging, retraction, inflamation, nipple discharge or skin changes noted.   Lungs:  Normal respiratory effort, chest expands symmetrically. Lungs are clear to auscultation, no crackles or wheezes. Heart:  Normal rate and regular rhythm. S1 and S2 normal without gallop, murmur, click, rub or other extra sounds. Rectal:  no external abnormalities and normal sphincter tone.   Genitalia:  no external lesions and no vaginal atrophy.   Msk:  No deformity or scoliosis noted of thoracic or lumbar spine.   Pulses:  R and L carotid,radial,femoral,dorsalis pedis and posterior tibial pulses are full and equal bilaterally Extremities:  No clubbing, cyanosis, edema, or deformity noted with normal full range of motion of all joints.   Neurologic:  No cranial nerve deficits noted. Station and gait are normal. Plantar reflexes are down-going bilaterally. DTRs are symmetrical throughout. Sensory, motor and coordinative functions appear intact.   Impression &  Recommendations:  Problem # 1:  PHYSICAL EXAMINATION (ICD-V70.0) The pt was asked about all immunizations, health maint. services that are appropriate to their age and was given guidance on diet exercize  and weight management  Mammogram: normal (11/28/2009) Pap smear: normal (06/17/2010) Td Booster: Tdap (03/18/2008)   Chol: 217 (06/08/2010)   HDL: 65.60 (06/08/2010)   LDL: DEL (03/06/2008)   TG: 198.0 (06/08/2010) TSH: 2.55 (06/08/2010)   Next mammogram due:: 11/2010 (06/17/2010)  Discussed using sunscreen, use of alcohol, drug use, self breast exam, routine dental care, routine eye care, schedule for GYN exam, routine physical exam, seat belts, multiple vitamins, osteoporosis prevention, adequate calcium intake in diet, recommendations for immunizations, mammograms and Pap smears.  Discussed exercise and checking cholesterol.  Discussed gun safety, safe sex, and contraception.  Problem # 2:  OTITIS MEDIA, RIGHT (ICD-382.9)  AND WAX IN THE RIGHT EAR WITH PERSISTANT CONJESTION The following medications were removed from the medication list:    Zithromax Z-pak 250 Mg Tabs (Azithromycin) .Marland Kitchen... As directed    Levaquin 500 Mg Tabs (Levofloxacin) .Marland Kitchen... 1  once daily for infection  Instructed on prevention and treatment. Call if no improvement in 48-72 hours or sooner if worsening symptoms.   Complete Medication List: 1)  Amlodipine Besy-benazepril Hcl 2.5-10 Mg Caps (Amlodipine besy-benazepril hcl) .... One by mouth daily 2)  Hydrochlorothiazide 12.5 Mg Caps (Hydrochlorothiazide) .... Once daily 3)  Fluoxetine Hcl 40 Mg Caps (Fluoxetine hcl) .... Once daily 4)  Valtrex 500 Mg Tabs (Valacyclovir hcl) .Marland KitchenMarland KitchenMarland Kitchen  1 once daily as needed 5)  Zyrtec-d Allergy & Congestion 5-120 Mg Xr12h-tab (Cetirizine-pseudoephedrine) .... One by mouth two times a day 6)  Krill Oil 1000 Mg Caps (Krill oil) .... Two by mouth two times a day  Patient Instructions: 1)  Please schedule a follow-up appointment in 6  months. 2)  Hepatic Panel prior to visit, ICD-9:995.20 3)  Lipid Panel prior to visit, ICD-9:272.4 Prescriptions: ZYRTEC-D ALLERGY & CONGESTION 5-120 MG XR12H-TAB (CETIRIZINE-PSEUDOEPHEDRINE) ONE by mouth two times a day  #60 x 4   Entered and Authorized by:   Stacie Glaze MD   Signed by:   Stacie Glaze MD on 06/17/2010   Method used:   Print then Give to Patient   RxID:   [id number] ZYRTEC-D ALLERGY & CONGESTION 5-120 MG XR12H-TAB (CETIRIZINE-PSEUDOEPHEDRINE) 1 once daily  #60 x 11   Entered by:   Willy Eddy, LPN   Authorized by:   Stacie Glaze MD   Signed by:   Willy Eddy, LPN on 78/46/9629   Method used:   Electronically to        [facility]* (retail)             ,          Ph: [phone]       Fax: [phone]   RxID:   [id number] FLUOXETINE HCL 40 MG  CAPS (FLUOXETINE HCL) once daily  #90 x 3   Entered by:   Willy Eddy, LPN   Authorized by:   Stacie Glaze MD   Signed by:   Willy Eddy, LPN on 43/32/9518   Method used:   Electronically to        [facility]* (retail)             ,          Ph: [phone]       Fax: [phone]   RxID:   [id number] HYDROCHLOROTHIAZIDE 12.5 MG  CAPS (HYDROCHLOROTHIAZIDE) once daily  #90 x 3   Entered by:   Willy Eddy, LPN   Authorized by:   Stacie Glaze MD   Signed by:   Willy Eddy, LPN on 28/31/5176   Method used:   Electronically to        [facility]* (retail)             ,          Ph: [phone]       Fax: [phone]   RxID:   [id number] AMLODIPINE BESY-BENAZEPRIL HCL 2.5-10 MG CAPS (AMLODIPINE BESY-BENAZEPRIL HCL) one by mouth daily  #90 x 3   Entered by:   Willy Eddy, LPN   Authorized by:   Stacie Glaze MD   Signed by:   Willy Eddy, LPN on 96/78/9381   Method used:   Electronically to        [facility]* (retail)             ,          Ph: [phone]       Fax: [phone]   RxID:    [id number]    Preventive Care Screening  Mammogram:    Date:  11/28/2009    Next Due:  11/2010    Results:  normal   Pap Smear:    Date:  06/17/2010    Next Due:  06/2011    Results:  normal

## 2010-12-31 NOTE — Progress Notes (Signed)
Summary: levaquin sick on stomach  Phone Note Call from Patient Call back at Home Phone [phone]   Call For: stafford for jenkins yesterday ov Summary of Call: Levaquin not agreeing with her.   Sick on her stomach, had to come home & go to bed.  Only took2 Hydroco last pm.  Can I get different antibiotic.  [facility].  Allergic to erythromycin.  Can take pcn.  Already had augmentin & zpak.   Will try with food if nothing else available.   Initial call taken by: Rudy Jew, RN,  June 03, 2010 3:02 PM  Follow-up for Phone Call        ok stop the levaquin and he will call in avelox to be taken after the main meal.  [facility]  Follow-up by: Pura Spice, RN,  June 03, 2010 3:35 PM     Appended Document: levaquin sick on stomach avelox  # 7 called in once daily with main meal . pt notified. ............gh rn........Marland Kitchen

## 2010-12-31 NOTE — Progress Notes (Signed)
Summary: sinus  Phone Note Call from Patient   Caller: Patient Call For: Stacie Glaze MD Summary of Call: Pt is asking for a Zpack for sinus infection.  Complaining of headache, post nasal drainage, coughing and congestion.  No fever. [facility] [address] [phone] Initial call taken by: Lynann Beaver CMA,  May 26, 2010 8:48 AM  Follow-up for Phone Call        per dr Lovell Sheehan-  may have z pack and can take otc sinus medication Follow-up by: Willy Eddy, LPN,  May 26, 2010 9:43 AM    New/Updated Medications: ZITHROMAX Z-PAK 250 MG TABS (AZITHROMYCIN) as directed Prescriptions: ZITHROMAX Z-PAK 250 MG TABS (AZITHROMYCIN) as directed  #6 x 0   Entered by:   Lynann Beaver CMA   Authorized by:   Stacie Glaze MD   Signed by:   Lynann Beaver CMA on 05/26/2010   Method used:   [facility]        [facility]  [address]   [id number] ([facility]ail)       [address] [place], [place]  [address]       Ph: [phone] or [phone]       Fax: [phone]   RxID:   [id number]

## 2010-12-31 NOTE — Progress Notes (Signed)
Summary: refill meds  Phone Note Call from Patient Call back at Home Phone [phone]   Caller: Patient Call For: Stacie Glaze MD Summary of Call: Lotrel called [facility] for one month please. [phone]  Initial call taken by: Lynann Beaver CMA AAMA,  October 26, 2010 10:24 AM    Prescriptions: AMLODIPINE BESY-BENAZEPRIL HCL 2.5-10 MG CAPS (AMLODIPINE BESY-BENAZEPRIL HCL) one by mouth daily  #30 x 1   Entered by:   Willy Eddy, LPN   Authorized by:   Stacie Glaze MD   Signed by:   Willy Eddy, LPN on 71/04/2693   Method used:   [facility] to        [facility] [phone][facility] (retail)       [address]       [place], [place]  [address]       Ph: [phone] or [phone]       Fax: [phone]   RxID:   [id number]

## 2010-12-31 NOTE — Assessment & Plan Note (Signed)
Summary: 6 month rov.njr   Vital Signs:  Patient profile:   55 year old female Height:      67 inches Weight:      222 pounds BMI:     34.90 Temp:     98.2 degrees F oral Pulse rate:   72 / minute Resp:     14 per minute BP sitting:   140 / 90  (left arm)  Vitals Entered By: Willy Eddy, LPN (December 16, 2010 4:38 PM) CC: roa-discuss med for allergic rhinitis, Hypertension Management Is Patient Diabetic? No   Primary Care Provider:  Stacie Glaze MD  CC:  roa-discuss med for allergic rhinitis and Hypertension Management.  History of Present Illness: alllergies or cold cannot breath out of right side of nose rattles at night right eye iritated zytec not helping using saline sprays not using OTC's due to blood pressure head aches  Hypertension History:      She denies headache, chest pain, palpitations, dyspnea with exertion, orthopnea, PND, peripheral edema, visual symptoms, neurologic problems, syncope, and side effects from treatment.        Positive major cardiovascular risk factors include hyperlipidemia and hypertension.  Negative major cardiovascular risk factors include female age less than 18 years old and non-tobacco-user status.        Further assessment for target organ damage reveals no history of ASHD, stroke/TIA, or peripheral vascular disease.     Preventive Screening-Counseling & Management  Alcohol-Tobacco     Smoking Status: never  Problems Prior to Update: 1)  Acute Maxillary Sinusitis  (ICD-461.0) 2)  Otitis Media, Right  (ICD-382.9) 3)  Overweight  (ICD-278.02) 4)  Foot Pain, Right  (ICD-729.5) 5)  Screening For Malignant Neoplasm of The Cervix  (ICD-V76.2) 6)  Physical Examination  (ICD-V70.0) 7)  Hypertension  (ICD-401.9) 8)  Hyperlipidemia  (ICD-272.4) 9)  Depression  (ICD-311) 10)  Allergic Rhinitis  (ICD-477.9)  Current Problems (verified): 1)  Acute Maxillary Sinusitis  (ICD-461.0) 2)  Otitis Media, Right  (ICD-382.9) 3)   Overweight  (ICD-278.02) 4)  Foot Pain, Right  (ICD-729.5) 5)  Screening For Malignant Neoplasm of The Cervix  (ICD-V76.2) 6)  Physical Examination  (ICD-V70.0) 7)  Hypertension  (ICD-401.9) 8)  Hyperlipidemia  (ICD-272.4) 9)  Depression  (ICD-311) 10)  Allergic Rhinitis  (ICD-477.9)  Medications Prior to Update: 1)  Amlodipine Besy-Benazepril Hcl 2.5-10 Mg Caps (Amlodipine Besy-Benazepril Hcl) .... One By Mouth Daily 2)  Hydrochlorothiazide 12.5 Mg  Caps (Hydrochlorothiazide) .... Once Daily 3)  Fluoxetine Hcl 40 Mg  Caps (Fluoxetine Hcl) .... Once Daily 4)  Valtrex 500 Mg Tabs (Valacyclovir Hcl) .Marland Kitchen.. 1 Once Daily As Needed 5)  Zyrtec-D Allergy & Congestion 5-120 Mg Xr12h-Tab (Cetirizine-Pseudoephedrine) .... One By Mouth Two Times A Day 6)  Krill Oil 1000 Mg Caps (Krill Oil) .... Two By Mouth Two Times A Day  Current Medications (verified): 1)  Amlodipine Besy-Benazepril Hcl 2.5-10 Mg Caps (Amlodipine Besy-Benazepril Hcl) .... One By Mouth Daily 2)  Hydrochlorothiazide 12.5 Mg  Caps (Hydrochlorothiazide) .... Once Daily 3)  Fluoxetine Hcl 40 Mg  Caps (Fluoxetine Hcl) .... Once Daily 4)  Valtrex 500 Mg Tabs (Valacyclovir Hcl) .Marland Kitchen.. 1 Once Daily As Needed 5)  Zyrtec-D Allergy & Congestion 5-120 Mg Xr12h-Tab (Cetirizine-Pseudoephedrine) .... One By Mouth Two Times A Day 6)  Krill Oil 1000 Mg Caps (Krill Oil) .... Two By Mouth Two Times A Day  Allergies (verified): No Known Drug Allergies  Past History:  Social History: Last updated:  03/18/2008 Occupation: Married Never Smoked  Risk Factors: Smoking Status: never (12/16/2010)  Past medical, surgical, family and social histories (including risk factors) reviewed, and no changes noted (except as noted below).  Past Medical History: Reviewed history from 09/29/2007 and no changes required. Allergic rhinitis Depression Hyperlipidemia Hypertension  Family History: Reviewed history and no changes required.  Social  History: Reviewed history from 03/18/2008 and no changes required. Occupation: Married Never Smoked  Review of Systems  The patient denies anorexia, fever, weight loss, weight gain, vision loss, decreased hearing, hoarseness, chest pain, syncope, dyspnea on exertion, peripheral edema, prolonged cough, headaches, hemoptysis, abdominal pain, melena, hematochezia, severe indigestion/heartburn, hematuria, incontinence, genital sores, muscle weakness, suspicious skin lesions, transient blindness, difficulty walking, depression, unusual weight change, abnormal bleeding, enlarged lymph nodes, angioedema, and breast masses.    Physical Exam  General:  Well-developed,well-nourished,in no acute distress; alert,appropriate and cooperative throughout examination Head:  Normocephalic and atraumatic without obvious abnormalities. No apparent alopecia or balding. Eyes:  vision grossly intact, pupils equal, and pupils round.   Ears:  R ear normal and L ear normal.   Nose:  mucosal edema and airflow obstruction.  sinus tenderness Neck:  supple and full ROM.   Lungs:  Normal respiratory effort, chest expands symmetrically. Lungs are clear to auscultation, no crackles or wheezes. Heart:  Normal rate and regular rhythm. S1 and S2 normal without gallop, murmur, click, rub or other extra sounds. Msk:  No deformity or scoliosis noted of thoracic or lumbar spine.   Extremities:  No clubbing, cyanosis, edema, or deformity noted with normal full range of motion of all joints.   Neurologic:  No cranial nerve deficits noted. Station and gait are normal. Plantar reflexes are down-going bilaterally. DTRs are symmetrical throughout. Sensory, motor and coordinative functions appear intact.   Impression & Recommendations:  Problem # 1:  OVERWEIGHT (ICD-278.02)  Ht: 67 (12/16/2010)   Wt: 222 (12/16/2010)   BMI: 34.90 (12/16/2010)  Problem # 2:  HYPERTENSION (ICD-401.9)  Her updated medication list for this problem  includes:    Amlodipine Besy-benazepril Hcl 2.5-10 Mg Caps (Amlodipine besy-benazepril hcl) ..... One by mouth daily    Hydrochlorothiazide 12.5 Mg Caps (Hydrochlorothiazide) ..... Once daily  BP today: 140/90 Prior BP: 132/84 (06/17/2010)  Prior 10 Yr Risk Heart Disease: Not enough information (03/18/2008)  Labs Reviewed: K+: 4.6 (06/08/2010) Creat: : 0.9 (06/08/2010)   Chol: 217 (06/08/2010)   HDL: 65.60 (06/08/2010)   LDL: DEL (03/06/2008)   TG: 198.0 (06/08/2010)  Problem # 3:  ALLERGIC RHINITIS (ICD-477.9) Assessment: Deteriorated new protocol for allergies Discussed use of allergy medications and environmental measures.   Her updated medication list for this problem includes:    Astepro 0.15 % Soln (Azelastine hcl) ..... One spray in nose two times a day    Veramyst 27.5 Mcg/spray Susp (Fluticasone furoate) ..... Oen spray  nare two times a day  Complete Medication List: 1)  Amlodipine Besy-benazepril Hcl 2.5-10 Mg Caps (Amlodipine besy-benazepril hcl) .... One by mouth daily 2)  Hydrochlorothiazide 12.5 Mg Caps (Hydrochlorothiazide) .... Once daily 3)  Fluoxetine Hcl 40 Mg Caps (Fluoxetine hcl) .... Once daily 4)  Valtrex 500 Mg Tabs (Valacyclovir hcl) .Marland Kitchen.. 1 once daily as needed 5)  Zyrtec-d Allergy & Congestion 5-120 Mg Xr12h-tab (Cetirizine-pseudoephedrine) .... One by mouth two times a day 6)  Krill Oil 1000 Mg Caps (Krill oil) .... Two by mouth two times a day 7)  Astepro 0.15 % Soln (Azelastine hcl) .... One spray in nose two  times a day 8)  Veramyst 27.5 Mcg/spray Susp (Fluticasone furoate) .... Oen spray  nare two times a day  Hypertension Assessment/Plan:      The patient's hypertensive risk group is category B: At least one risk factor (excluding diabetes) with no target organ damage.  Today's blood pressure is 140/90.  Her blood pressure goal is < 140/90.  Patient Instructions: 1)  Please schedule a follow-up appointment in 2 months. Prescriptions: ZYRTEC-D  ALLERGY & CONGESTION 5-120 MG XR12H-TAB (CETIRIZINE-PSEUDOEPHEDRINE) ONE by mouth two times a day  #60 x 11   Entered and Authorized by:   Stacie Glaze MD   Signed by:   Stacie Glaze MD on 12/16/2010   Method used:   Electronically to        [facility] [address]* (retail)       [address]       [place], [place]  [address]       Ph: [phone]       Fax: [phone]   RxID:   [id number]    Orders Added: 1)  Est. Patient Level IV [84132]

## 2011-01-13 ENCOUNTER — Encounter (INDEPENDENT_AMBULATORY_CARE_PROVIDER_SITE_OTHER): Payer: Self-pay | Admitting: *Deleted

## 2011-01-14 ENCOUNTER — Encounter: Payer: Self-pay | Admitting: Gastroenterology

## 2011-01-20 NOTE — Letter (Signed)
Summary: [facility] Instructions  [facility]  [address] [place], [place] [address]   Phone: [phone]  Fax: [phone]       Taylor Morales    1956/06/15    MRN: [id number]        Procedure Day Dorna Bloom:  Farrell Ours  01/22/11     Arrival Time:  9:30AM     Procedure Time:  10:30AM     Location of Procedure:                    Juliann Pares _  [facility] (4th Floor)                      PREPARATION FOR COLONOSCOPY WITH MOVIPREP   Starting 5 days prior to your procedure 01/17/11 do not eat nuts, seeds, popcorn, corn, beans, peas,  salads, or any raw vegetables.  Do not take any fiber supplements (e.g. Metamucil, Citrucel, and Benefiber).  THE DAY BEFORE YOUR PROCEDURE         DATE: 01/21/11  DAY: THURSDAY  1.  Drink clear liquids the entire day-NO SOLID FOOD  2.  Do not drink anything colored red or purple.  Avoid juices with pulp.  No orange juice.  3.  Drink at least 64 oz. (8 glasses) of fluid/clear liquids during the day to prevent dehydration and help the prep work efficiently.  CLEAR LIQUIDS INCLUDE: Water Jello Ice Popsicles Tea (sugar ok, no milk/cream) Powdered fruit flavored drinks Coffee (sugar ok, no milk/cream) Gatorade Juice: apple, white grape, white cranberry  Lemonade Clear bullion, consomm, broth Carbonated beverages (any kind) Strained chicken noodle soup Hard Candy                             4.  In the morning, mix first dose of MoviPrep solution:    Empty 1 Pouch A and 1 Pouch B into the disposable container    Add lukewarm drinking water to the top line of the container. Mix to dissolve    Refrigerate (mixed solution should be used within 24 hrs)  5.  Begin drinking the prep at 5:00 p.m. The MoviPrep container is divided by 4 marks.   Every 15 minutes drink the solution down to the next mark (approximately 8 oz) until the full liter is complete.   6.  Follow completed prep with 16 oz of clear liquid of your choice (Nothing red  or purple).  Continue to drink clear liquids until bedtime.  7.  Before going to bed, mix second dose of MoviPrep solution:    Empty 1 Pouch A and 1 Pouch B into the disposable container    Add lukewarm drinking water to the top line of the container. Mix to dissolve    Refrigerate  THE DAY OF YOUR PROCEDURE      DATE: 01/22/11   DAY: FRIDAY  Beginning at 5:30AM (5 hours before procedure):         1. Every 15 minutes, drink the solution down to the next mark (approx 8 oz) until the full liter is complete.  2. Follow completed prep with 16 oz. of clear liquid of your choice.    3. You may drink clear liquids until 8:30AM (2 HOURS BEFORE PROCEDURE).   MEDICATION INSTRUCTIONS  Unless otherwise instructed, you should take regular prescription medications with a small sip of water   as early as possible the morning of  your procedure.  Additional medication instructions: HOLD HCTZ before coming in the day of the procedure only         OTHER INSTRUCTIONS  You will need a responsible adult at least 55 years of age to accompany you and drive you home.   This person must remain in the waiting room during your procedure.  Wear loose fitting clothing that is easily removed.  Leave jewelry and other valuables at home.  However, you may wish to bring a book to read or  an iPod/MP3 player to listen to music as you wait for your procedure to start.  Remove all body piercing jewelry and leave at home.  Total time from sign-in until discharge is approximately 2-3 hours.  You should go home directly after your procedure and rest.  You can resume normal activities the  day after your procedure.  The day of your procedure you should not:   Drive   Make legal decisions   Operate machinery   Drink alcohol   Return to work  You will receive specific instructions about eating, activities and medications before you leave.    The above instructions have been reviewed and  explained to me by  Karl Bales RN  January 14, 2011 4:33 PM    I fully understand and can verbalize these instructions _____________________________ Date _________

## 2011-01-20 NOTE — Miscellaneous (Signed)
Summary: [facility] previsit  Clinical Lists Changes  Medications: Added new medication of MOVIPREP 100 GM  SOLR (PEG-KCL-NACL-NASULF-NA ASC-C) As per prep instructions. - Signed Rx of MOVIPREP 100 GM  SOLR (PEG-KCL-NACL-NASULF-NA ASC-C) As per prep instructions.;  #1 x 0;  Signed;  Entered by: Karl Bales RN;  Authorized by: Meryl Dare MD [facility];  Method used: [id number] [address] [place], [place]  [address], Ph: [phone] or [phone], Fax: [phone] Observations: Added new observation of NKA: T (01/14/2011 16:04)    Prescriptions: MOVIPREP 100 GM  SOLR (PEG-KCL-NACL-NASULF-NA ASC-C) As per prep instructions.  #1 x 0   Entered by:   Karl Bales RN   Authorized by:   Meryl Dare MD [facility]   Signed by:   Karl Bales RN on 01/14/2011   Method used:   [facility] to        [facility] [phone]* (retail)       [address]       [place], [place]  [address]       Ph: [phone] or [phone]       Fax: [phone]   RxID:   [id number]

## 2011-01-20 NOTE — Letter (Signed)
Summary: Pre Visit Letter Revised  [facility]  [address] [place], [place] [address]   Phone: [phone]  Fax: [phone]        01/13/2011 MRN: [id number] DODI LEU [address] [place], [place]  [address]             Procedure Date:  01/22/2011 @ 10:30   Direct colon-Dr. Russella Dar   Welcome to the Gastroenterology Division at [facility].    You are scheduled to see a nurse for your pre-procedure visit on 01/14/2011 at 4:00on the 3rd floor at [facility], [address]  We ask that you try to arrive at our office 15 minutes prior to your appointment time to allow for check-in.  Please take a minute to review the attached form.  If you answer "Yes" to one or more of the questions on the first page, we ask that you call the person listed at your earliest opportunity.  If you answer "No" to all of the questions, please complete the rest of the form and bring it to your appointment.    Your nurse visit will consist of discussing your medical and surgical history, your immediate family medical history, and your medications.   If you are unable to list all of your medications on the form, please bring the medication bottles to your appointment and we will list them.  We will need to be aware of both prescribed and over the counter drugs.  We will need to know exact dosage information as well.    Please be prepared to read and sign documents such as consent forms, a financial agreement, and acknowledgement forms.  If necessary, and with your consent, a friend or relative is welcome to sit-in on the nurse visit with you.  Please bring your insurance card so that we may make a copy of it.  If your insurance requires a referral to see a specialist, please bring your referral form from your primary care physician.  No co-pay is required for this nurse visit.     If you cannot keep your appointment, please call [phone] to cancel or reschedule prior to  your appointment date.  This allows [place] the opportunity to schedule an appointment for another patient in need of care.    Thank you for choosing  Gastroenterology for your medical needs.  We appreciate the opportunity to care for you.  Please visit [place] at our website  to learn more about our practice.  Sincerely, The Gastroenterology Division

## 2011-01-22 ENCOUNTER — Other Ambulatory Visit: Payer: Self-pay | Admitting: Gastroenterology

## 2011-03-22 ENCOUNTER — Telehealth: Payer: Self-pay | Admitting: *Deleted

## 2011-03-22 NOTE — Telephone Encounter (Signed)
Left message on machine Call back so we call make appointment

## 2011-03-22 NOTE — Telephone Encounter (Signed)
Under tremendous amount of stress, thinks BP elevated, feeling nauseous and has shakes.  Pt was offered 1:30 appt with Lovell Sheehan, she declined due to lack tranportation.  Offered appt with another provider, stated she only wants to see Lovell Sheehan and requests to be worked in later this afternoon or tomorrow.  Please advise.

## 2011-03-30 ENCOUNTER — Encounter: Payer: Self-pay | Admitting: Internal Medicine

## 2011-04-16 NOTE — Op Note (Signed)
NAMEMIYOKO, Taylor Morales                  ACCOUNT NO.:  [id number]   MEDICAL RECORD NO.:  [id number]          PATIENT TYPE:  AMB   LOCATION:  [facility]                          FACILITY:  [facility]   PHYSICIAN:  Ollen Gross. Vernell Morgans, M.D. DATE OF BIRTH:  [birth year]   DATE OF PROCEDURE:  12/18/2004  DATE OF DISCHARGE:                                 OPERATIVE REPORT   PREOPERATIVE DIAGNOSIS:  Complex sclerosing lesion of the left breast.   POSTOPERATIVE DIAGNOSIS:  Complex sclerosing lesion of the left breast.   PROCEDURE:  Left breast lumpectomy.   SURGEON:  Ollen Gross. Carolynne Edouard, M.D.   ANESTHESIA:  General via LMA.   PROCEDURE:  After informed consent was obtained, the patient was brought to  the operating room and placed in a supine position on the operating room  table.  After adequate induction of general anesthesia, the patient's left  breast was prepped with Betadine and draped in the usual sterile manner.  The area had been localized to the 12 o'clock position subareolar,  approximately 1 cm from the nipple.  A periareolar incision was made in the  12 o'clock position of the breast with a 15 blade knife.  This incision was  carried down through the skin and subcutaneous tissue sharply with the  electrocautery.  The dissection was carried up under the nipple-areolar  complex, and the area of breast tissue in this 12 o'clock position was  excised sharply with the electrocautery in a circumferential manner.  The  cavity was then palpated, and no other masses were able to be appreciated.  The specimen itself was oriented with the short single stitch being the  cranial margin, the long single stitch being the lateral margin, and the  double stitch being the skin margin.  The cavity was then inspected and  found to be completely hemostatic.  The area was infiltrated with 0.25%  Marcaine, and the skin was then closed with a running 4-0 Monocryl  subcuticular stitch.  Benzoin, Steri-Strips, and sterile  dressings were  applied.  The patient tolerated the procedure well.  At the end of the case,  all needle, sponge, and instrument counts were correct.  The patient was  then awakened and taken to the recovery room in stable condition.      PST/MEDQ  D:  12/18/2004  T:  12/18/2004  Job:  [id number]

## 2011-05-14 ENCOUNTER — Telehealth: Payer: Self-pay | Admitting: *Deleted

## 2011-05-14 ENCOUNTER — Other Ambulatory Visit: Payer: Self-pay | Admitting: *Deleted

## 2011-05-14 MED ORDER — FLUOXETINE HCL 40 MG PO CAPS: 40.0000 mg | ORAL_CAPSULE | Freq: Every day | ORAL | Status: DC

## 2011-05-14 NOTE — Telephone Encounter (Signed)
Pt needs #30 days of Prozac to [facility]) and #90 to pick up prescription.  [phone]

## 2011-08-03 ENCOUNTER — Telehealth: Payer: Self-pay | Admitting: Internal Medicine

## 2011-08-03 NOTE — Telephone Encounter (Signed)
Taylor Morales DOB [birth year] lost his job and will only have insurance for him and his wife till the end of September. Taylor Pore has an appt 08/30/11 and needs to be rescheduled for Sept for his insurance to cover and [name] was wondering if she could be worked in for cpx before the end of Sept. Please contact pt.

## 2011-08-09 NOTE — Telephone Encounter (Signed)
Pt callback requesting cpxs (Taylor Morales) before 08-30-2011 due to lost of insurance.

## 2011-08-11 NOTE — Telephone Encounter (Signed)
Appointment on 9-21 -they are to call back and let me know which one wants it-currently wife is in that slot

## 2011-08-20 ENCOUNTER — Other Ambulatory Visit (INDEPENDENT_AMBULATORY_CARE_PROVIDER_SITE_OTHER): Payer: BC Managed Care – PPO

## 2011-08-20 ENCOUNTER — Encounter: Payer: BC Managed Care – PPO | Admitting: Internal Medicine

## 2011-08-20 DIAGNOSIS — Z0000 Encounter for general adult medical examination without abnormal findings: Secondary | ICD-10-CM

## 2011-08-20 LAB — TSH: TSH: 1.42 u[IU]/mL (ref 0.35–5.50)

## 2011-08-20 LAB — HEPATIC FUNCTION PANEL
ALT: 20 U/L (ref 0–35)
AST: 21 U/L (ref 0–37)
Albumin: 4 g/dL (ref 3.5–5.2)
Alkaline Phosphatase: 81 U/L (ref 39–117)
Bilirubin, Direct: 0 mg/dL (ref 0.0–0.3)
Total Bilirubin: 0.4 mg/dL (ref 0.3–1.2)
Total Protein: 7.4 g/dL (ref 6.0–8.3)

## 2011-08-20 LAB — POCT URINALYSIS DIPSTICK
Bilirubin, UA: NEGATIVE
Glucose, UA: NEGATIVE
Nitrite, UA: NEGATIVE
Spec Grav, UA: 1.02
Urobilinogen, UA: 0.2
pH, UA: 7

## 2011-08-20 LAB — BASIC METABOLIC PANEL
BUN: 14 mg/dL (ref 6–23)
Calcium: 8.7 mg/dL (ref 8.4–10.5)
Creatinine, Ser: 0.7 mg/dL (ref 0.4–1.2)
GFR: 93.79 mL/min (ref >60.00)
Glucose, Bld: 104 mg/dL — ABNORMAL HIGH (ref 70–99)
Potassium: 4.1 mEq/L (ref 3.5–5.1)
Sodium: 143 mEq/L (ref 135–145)

## 2011-08-20 LAB — CBC WITH DIFFERENTIAL/PLATELET
Basophils Absolute: 0.1 10*3/uL (ref 0.0–0.1)
Eosinophils Absolute: 0.3 10*3/uL (ref 0.0–0.7)
Eosinophils Relative: 4.6 % (ref 0.0–5.0)
HCT: 39.6 % (ref 36.0–46.0)
Lymphocytes Relative: 22.3 % (ref 12.0–46.0)
MCHC: 33.8 g/dL (ref 30.0–36.0)
MCV: 94.9 fl (ref 78.0–100.0)
Monocytes Absolute: 0.4 10*3/uL (ref 0.1–1.0)
Monocytes Relative: 6.1 % (ref 3.0–12.0)
Neutrophils Relative %: 66.2 % (ref 43.0–77.0)
Platelets: 317 10*3/uL (ref 150.0–400.0)
RBC: 4.17 Mil/uL (ref 3.87–5.11)
RDW: 12.5 % (ref 11.5–14.6)
WBC: 6.7 10*3/uL (ref 4.5–10.5)

## 2011-08-20 LAB — LIPID PANEL
Cholesterol: 228 mg/dL — ABNORMAL HIGH (ref 0–200)
Total CHOL/HDL Ratio: 3
Triglycerides: 147 mg/dL (ref 0.0–149.0)
VLDL: 29.4 mg/dL (ref 0.0–40.0)

## 2011-08-24 ENCOUNTER — Other Ambulatory Visit: Payer: Self-pay | Admitting: *Deleted

## 2011-08-24 ENCOUNTER — Encounter: Payer: Self-pay | Admitting: Internal Medicine

## 2011-08-24 ENCOUNTER — Other Ambulatory Visit (HOSPITAL_COMMUNITY)
Admission: RE | Admit: 2011-08-24 | Discharge: 2011-08-24 | Disposition: A | Discharge status: Home / Self Care | Payer: BC Managed Care – PPO | Source: Ambulatory Visit | Attending: Internal Medicine | Admitting: Internal Medicine

## 2011-08-24 ENCOUNTER — Ambulatory Visit (INDEPENDENT_AMBULATORY_CARE_PROVIDER_SITE_OTHER): Payer: BC Managed Care – PPO | Admitting: Internal Medicine

## 2011-08-24 DIAGNOSIS — E785 Hyperlipidemia, unspecified: Secondary | ICD-10-CM

## 2011-08-24 DIAGNOSIS — Z0000 Encounter for general adult medical examination without abnormal findings: Secondary | ICD-10-CM

## 2011-08-24 DIAGNOSIS — Z23 Encounter for immunization: Secondary | ICD-10-CM

## 2011-08-24 DIAGNOSIS — Z01419 Encounter for gynecological examination (general) (routine) without abnormal findings: Secondary | ICD-10-CM | POA: Insufficient documentation

## 2011-08-24 MED ORDER — FLUTICASONE FUROATE 27.5 MCG/SPRAY NA SUSP: 1.0000 | Freq: Two times a day (BID) | NASAL | Status: DC

## 2011-08-24 MED ORDER — FLUOXETINE HCL 40 MG PO CAPS: 40.0000 mg | ORAL_CAPSULE | Freq: Every day | ORAL | Status: DC

## 2011-08-24 MED ORDER — AMLODIPINE-ATORVASTATIN 2.5-10 MG PO TABS: 1.0000 | ORAL_TABLET | Freq: Every day | ORAL | Status: DC

## 2011-08-24 MED ORDER — HYDROCHLOROTHIAZIDE 12.5 MG PO CAPS: 12.5000 mg | ORAL_CAPSULE | Freq: Every day | ORAL | Status: DC

## 2011-08-24 MED ORDER — AZELASTINE HCL 0.15 % NA SOLN: 1.0000 | Freq: Two times a day (BID) | NASAL | Status: DC

## 2011-08-24 NOTE — Patient Instructions (Addendum)
Weight loss Lipids are elevated  Need to take the kril oil twice a day

## 2011-08-24 NOTE — Progress Notes (Signed)
Addended by: Willy Eddy on: 08/24/2011 03:41 PM   Modules accepted: Orders

## 2011-08-24 NOTE — Progress Notes (Signed)
Subjective:    Patient ID: Taylor Morales, female    DOB: 24-May-1956, 55 y.o.   MRN: [id number]  HPI Patient presents for complete physical examination.  She has gained weight her blood pressure is elevated and her cholesterol is elevated over goal.  The primary etiology of both of these problems is the weight gain.  We reviewed her weight chart with her and then noted that she is now designated as morbidly obese   Review of Systems  Constitutional: Negative for activity change, appetite change and fatigue.  HENT: Negative for ear pain, congestion, neck pain, postnasal drip and sinus pressure.   Eyes: Negative for redness and visual disturbance.  Respiratory: Negative for cough, shortness of breath and wheezing.   Gastrointestinal: Negative for abdominal pain and abdominal distention.  Genitourinary: Negative for dysuria, frequency and menstrual problem.  Musculoskeletal: Negative for myalgias, joint swelling and arthralgias.  Skin: Negative for rash and wound.  Neurological: Negative for dizziness, weakness and headaches.  Hematological: Negative for adenopathy. Does not bruise/bleed easily.  Psychiatric/Behavioral: Negative for sleep disturbance and decreased concentration.   Past Medical History  Diagnosis Date  . Allergy     rhinitis  . Depression   . Hyperlipidemia   . Hypertension    No past surgical history on file.  reports that she has never smoked. She does not have any smokeless tobacco history on file. Her alcohol and drug histories not on file. family history is not on file. Not on File c   Objective:   Physical Exapm  Nursing note and vitals reviewed. Constitutional: She is oriented to person, place, and time. She appears well-developed and well-nourished. No distress.  HENT:  Head: Normocephalic and atraumatic.  Right Ear: External ear normal.  Left Ear: External ear normal.  Nose: Nose normal.  Mouth/Throat: Oropharynx is clear and moist.  Eyes:  Conjunctivae and EOM are normal. Pupils are equal, round, and reactive to light.  Neck: Normal range of motion. Neck supple. No JVD present. No tracheal deviation present. No thyromegaly present.  Cardiovascular: Normal rate, regular rhythm, normal heart sounds and intact distal pulses.   No murmur heard. Pulmonary/Chest: Effort normal and breath sounds normal. She has no wheezes. She exhibits no tenderness.  Abdominal: Soft. Bowel sounds are normal.  Musculoskeletal: Normal range of motion. She exhibits no edema and no tenderness.  Lymphadenopathy:    She has no cervical adenopathy.  Neurological: She is alert and oriented to person, place, and time. She has normal reflexes. No cranial nerve deficit.  Skin: Skin is warm and dry. She is not diaphoretic.  Psychiatric: She has a normal mood and affect. Her behavior is normal.          Assessment & Plan:   This is a routine physical examination for this healthy  Female. Reviewed all health maintenance protocols including mammography colonoscopy bone density and reviewed appropriate screening labs. Her immunization history was reviewed as well as her current medications and allergies refills of her chronic medications were given and the plan for yearly health maintenance was discussed all orders and referrals were made as appropriate.   patient has elevated cholesterol in the past she had been on Kerlone which is not been compliant with medications I recommended compliance with medications and weight loss.  She'll followup in 6 months with a lipid and liver at that time.  Her blood pressure was moderately elevated he can weight loss was the primary intervention appropriate for this patient  Repeat t  pressure 138/82

## 2011-08-26 ENCOUNTER — Other Ambulatory Visit: Payer: Self-pay | Admitting: *Deleted

## 2011-08-26 MED ORDER — HYDROCHLOROTHIAZIDE 12.5 MG PO CAPS: 12.5000 mg | ORAL_CAPSULE | Freq: Every day | ORAL | Status: DC

## 2011-08-26 MED ORDER — AMLODIPINE-ATORVASTATIN 2.5-10 MG PO TABS: 1.0000 | ORAL_TABLET | Freq: Every day | ORAL | Status: DC

## 2011-08-26 MED ORDER — FLUOXETINE HCL 40 MG PO CAPS: 40.0000 mg | ORAL_CAPSULE | Freq: Every day | ORAL | Status: DC

## 2011-08-26 MED ORDER — AZELASTINE HCL 0.15 % NA SOLN: 1.0000 | Freq: Two times a day (BID) | NASAL | Status: DC

## 2011-08-26 MED ORDER — FLUTICASONE FUROATE 27.5 MCG/SPRAY NA SUSP: 1.0000 | Freq: Two times a day (BID) | NASAL | Status: DC

## 2011-09-23 ENCOUNTER — Telehealth: Payer: Self-pay | Admitting: Internal Medicine

## 2011-09-23 MED ORDER — AZITHROMYCIN 250 MG PO TABS: ORAL_TABLET | ORAL | Status: AC

## 2011-09-23 NOTE — Telephone Encounter (Signed)
Per dr jenkins-may have z pack 

## 2011-09-23 NOTE — Telephone Encounter (Signed)
rx for zpak sent in to pharmacy.  Pt aware.

## 2011-09-23 NOTE — Telephone Encounter (Signed)
Pt called and is req a zpak for uri. Pt has a lot of head and chest congrestion. Pt has been taking liquid mucinex otc, but it is not helping. [facility] [address] [phone]

## 2011-10-01 ENCOUNTER — Other Ambulatory Visit: Payer: Self-pay | Admitting: Internal Medicine

## 2011-10-01 MED ORDER — AZITHROMYCIN 250 MG PO TABS: ORAL_TABLET | ORAL | Status: AC

## 2011-10-01 NOTE — Telephone Encounter (Signed)
May have refill per dr Lovell Sheehan

## 2011-10-01 NOTE — Telephone Encounter (Signed)
Notified pt. 

## 2011-10-01 NOTE — Telephone Encounter (Signed)
Pt called and said that she has finished zpak on Monday 09/17/11. Pt still has uri and is req a [facility] [address]

## 2011-10-12 ENCOUNTER — Telehealth: Payer: Self-pay | Admitting: Internal Medicine

## 2011-10-12 MED ORDER — FLUOXETINE HCL 40 MG PO CAPS: 40.0000 mg | ORAL_CAPSULE | Freq: Every day | ORAL | Status: DC

## 2011-10-12 MED ORDER — AMLODIPINE-ATORVASTATIN 2.5-10 MG PO TABS: 1.0000 | ORAL_TABLET | Freq: Every day | ORAL | Status: DC

## 2011-10-12 NOTE — Telephone Encounter (Signed)
Pt called and has changed Prescription [facility] to [facility] and will needs refills faxed in to fax# [phone] member id # [id number] amlodipine-atorvastatin (CADUET) 2.5-10 MG per tablet and FLUoxetine (PROZAC) 40 MG capsule 90 day supply with refills.

## 2011-10-13 ENCOUNTER — Telehealth: Payer: Self-pay | Admitting: Internal Medicine

## 2011-10-13 NOTE — Telephone Encounter (Signed)
Pt said that abxs helped chest congestion,but not head congestion. Pt has been taking otc [facility] brand of severe cold and sinus,which contains the following:  acetaminophen 325 mg, gualfensin 200 mg, phenydephrine HCI 5 mg, but pt said that this is not strong enough. Pt req a strong med to fight head congestion. [facility] [address]

## 2011-10-13 NOTE — Telephone Encounter (Signed)
Pt. Notified.

## 2011-10-13 NOTE — Telephone Encounter (Signed)
Per dr Lovell Sheehan -try otc saline nasal spray

## 2011-11-05 ENCOUNTER — Other Ambulatory Visit: Payer: Self-pay | Admitting: Internal Medicine

## 2011-11-05 ENCOUNTER — Telehealth: Payer: Self-pay | Admitting: *Deleted

## 2011-11-05 MED ORDER — HYDROCHLOROTHIAZIDE 12.5 MG PO CAPS: 12.5000 mg | ORAL_CAPSULE | Freq: Every day | ORAL | Status: DC

## 2011-11-05 NOTE — Telephone Encounter (Signed)
Pt req 90 day supply refill for hydrochlorothiazide (MICROZIDE) 12.5 MG capsule to [facility]

## 2011-11-05 NOTE — Telephone Encounter (Signed)
DONE

## 2012-02-07 ENCOUNTER — Telehealth: Payer: Self-pay | Admitting: Internal Medicine

## 2012-02-07 ENCOUNTER — Ambulatory Visit (INDEPENDENT_AMBULATORY_CARE_PROVIDER_SITE_OTHER): Payer: BC Managed Care – PPO | Admitting: Family

## 2012-02-07 ENCOUNTER — Encounter: Payer: Self-pay | Admitting: Family

## 2012-02-07 VITALS — BP 120/80 | HR 100 | Temp 99.0°F | Wt 221.0 lb

## 2012-02-07 DIAGNOSIS — J019 Acute sinusitis, unspecified: Secondary | ICD-10-CM

## 2012-02-07 DIAGNOSIS — R0982 Postnasal drip: Secondary | ICD-10-CM

## 2012-02-07 MED ORDER — HYDROCODONE-HOMATROPINE 5-1.5 MG/5ML PO SYRP: 5.0000 mL | ORAL_SOLUTION | Freq: Three times a day (TID) | ORAL | Status: DC | PRN

## 2012-02-07 MED ORDER — AZITHROMYCIN 250 MG PO TABS: 250.0000 mg | ORAL_TABLET | Freq: Every day | ORAL | Status: AC

## 2012-02-07 NOTE — Telephone Encounter (Signed)
Rx called in to pharmacy. 

## 2012-02-07 NOTE — Progress Notes (Signed)
  Subjective:    Patient ID: Taylor Morales, female    DOB: 06-29-1956, 56 y.o.   MRN: [id number]  HPI Comments: C/o sinus drainage,sorethroat, productive cough with green-tinged sputum, and chills x 4 days. Took OTC vicks daytime and nighttime and delsom with no relief. Has h/o chronic sinusitis and several individuals at work have been sick. Denies dyspnea, nausea, vomiting, headache, or muscle aches.   Sinusitis Associated symptoms include congestion, sinus pressure and a sore throat. Pertinent negatives include no ear pain, headaches, neck pain or sneezing.      Review of Systems  HENT: Positive for congestion, sore throat, rhinorrhea, postnasal drip and sinus pressure. Negative for hearing loss, ear pain, nosebleeds, facial swelling, sneezing, trouble swallowing, neck pain, neck stiffness and tinnitus.   Eyes: Negative.   Respiratory: Negative.   Cardiovascular: Negative.   Neurological: Negative for headaches.       Objective:   Physical Exam  Constitutional: She is oriented to person, place, and time. She appears well-developed and well-nourished. No distress.  HENT:  Right Ear: External ear normal.  Left Ear: External ear normal.  Nose: Nose normal.  Mouth/Throat: Oropharyngeal exudate present.  Eyes: Right eye exhibits no discharge. Left eye exhibits no discharge.  Cardiovascular: Normal rate, regular rhythm, normal heart sounds and intact distal pulses.  Exam reveals no gallop and no friction rub.   No murmur heard. Pulmonary/Chest: Effort normal and breath sounds normal. No respiratory distress. She has no wheezes. She has no rales. She exhibits no tenderness.  Neurological: She is alert and oriented to person, place, and time.  Skin: Skin is warm and dry. She is not diaphoretic.          Assessment & Plan:  Assessment: Chronic sinusitis, URI Plan: Hycodan syrup, azithromycin-pack, increase po fluid intake, cough and deep breath, rest, RTC if s/s get worse or do not  begin to resolve in a week, teaching handouts provided diagnosis and medications

## 2012-02-07 NOTE — Telephone Encounter (Signed)
[facility] for HYDROcodone-homatropine (HYCODAN) 5-1.5 MG/5ML syrup 120 mL 0 02/07/2012 02/17/2012 Take 5 mLs by mouth every 8 (eight) hours as needed for cough. - Oral Be resent they never received rx.

## 2012-02-07 NOTE — Patient Instructions (Signed)

## 2012-02-08 ENCOUNTER — Telehealth: Payer: Self-pay | Admitting: *Deleted

## 2012-02-08 NOTE — Telephone Encounter (Signed)
Unable to reach pt's daughter  

## 2012-02-10 ENCOUNTER — Other Ambulatory Visit (INDEPENDENT_AMBULATORY_CARE_PROVIDER_SITE_OTHER): Payer: BC Managed Care – PPO

## 2012-02-10 DIAGNOSIS — E785 Hyperlipidemia, unspecified: Secondary | ICD-10-CM

## 2012-02-10 LAB — LIPID PANEL
Cholesterol: 178 mg/dL (ref 0–200)
Total CHOL/HDL Ratio: 3
Triglycerides: 132 mg/dL (ref 0.0–149.0)

## 2012-02-10 LAB — HEPATIC FUNCTION PANEL
ALT: 25 U/L (ref 0–35)
AST: 25 U/L (ref 0–37)
Bilirubin, Direct: 0 mg/dL (ref 0.0–0.3)
Total Protein: 7.1 g/dL (ref 6.0–8.3)

## 2012-02-11 ENCOUNTER — Telehealth: Payer: Self-pay | Admitting: Internal Medicine

## 2012-02-11 MED ORDER — HYDROCODONE-HOMATROPINE 5-1.5 MG/5ML PO SYRP: 5.0000 mL | ORAL_SOLUTION | Freq: Three times a day (TID) | ORAL | Status: AC | PRN

## 2012-02-11 MED ORDER — LEVOFLOXACIN 500 MG PO TABS: 500.0000 mg | ORAL_TABLET | Freq: Every day | ORAL | Status: DC

## 2012-02-11 NOTE — Telephone Encounter (Signed)
Per dr Yvonne Kendall have levaquiin 500 qd for 7 days

## 2012-02-11 NOTE — Telephone Encounter (Signed)
Notified pt. 

## 2012-02-11 NOTE — Telephone Encounter (Signed)
Pt still has cough and chest congestion. Also pts ears are clogged. Pt was in to see Padonda 02/07/12 and was given a zpak. Pt says that zpack is not strong enough. Req stronger abx, also a refill of HYDROcodone-homatropine (HYCODAN) 5-1.5 MG/5ML syrup [facility] [address] [facility] [place].

## 2012-02-14 ENCOUNTER — Other Ambulatory Visit: Payer: BC Managed Care – PPO

## 2012-02-18 ENCOUNTER — Ambulatory Visit (INDEPENDENT_AMBULATORY_CARE_PROVIDER_SITE_OTHER): Payer: BC Managed Care – PPO | Admitting: Internal Medicine

## 2012-02-18 VITALS — BP 124/80 | HR 72 | Temp 98.0°F | Resp 16 | Ht 68.0 in | Wt 220.0 lb

## 2012-02-18 DIAGNOSIS — J45909 Unspecified asthma, uncomplicated: Secondary | ICD-10-CM

## 2012-02-18 DIAGNOSIS — J189 Pneumonia, unspecified organism: Secondary | ICD-10-CM

## 2012-02-18 DIAGNOSIS — J309 Allergic rhinitis, unspecified: Secondary | ICD-10-CM

## 2012-02-18 DIAGNOSIS — Z9109 Other allergy status, other than to drugs and biological substances: Secondary | ICD-10-CM

## 2012-02-18 DIAGNOSIS — J157 Pneumonia due to Mycoplasma pneumoniae: Secondary | ICD-10-CM

## 2012-02-18 MED ORDER — METHYLPREDNISOLONE (PAK) 4 MG PO TABS: ORAL_TABLET | ORAL | Status: AC

## 2012-02-18 MED ORDER — LEVOFLOXACIN 500 MG PO TABS: 500.0000 mg | ORAL_TABLET | Freq: Every day | ORAL | Status: AC

## 2012-02-18 MED ORDER — MOMETASONE FUROATE 50 MCG/ACT NA SUSP: 2.0000 | Freq: Every day | NASAL | Status: AC

## 2012-02-18 MED ORDER — HYDROCODONE-HOMATROPINE 5-1.5 MG/5ML PO SYRP: 5.0000 mL | ORAL_SOLUTION | Freq: Three times a day (TID) | ORAL | Status: AC | PRN

## 2012-02-21 ENCOUNTER — Ambulatory Visit: Payer: BC Managed Care – PPO | Admitting: Internal Medicine

## 2012-02-22 ENCOUNTER — Telehealth: Payer: Self-pay | Admitting: Internal Medicine

## 2012-02-22 ENCOUNTER — Encounter: Payer: Self-pay | Admitting: Internal Medicine

## 2012-02-22 NOTE — Progress Notes (Signed)
  Subjective:    Patient ID: Taylor Morales, female    DOB: [birth year], 56 y.o.   MRN: [id number]  HPI  Patient is seen today in follow up of upper respiratory tract infection.  She was initially treated with a course of azithromycin which she failed.  Levaquin was then called in and an appointment made for followup.  She states that she has improved significantly since starting on the Levaquin but still has a slight cough and congestion. The wheezing and chest tightness has largely resolved. Her husband became sick about a week after she became 6 suggesting that this was a more likely to be attributable disease such as a virus or walking pneumonia given her symptomology and her response to Levaquin with the presumptive diagnosis will be "walking pneumonia"   Review of Systems  Constitutional: Positive for fever, chills, appetite change and fatigue. Negative for activity change.  HENT: Negative for ear pain, congestion, neck pain, postnasal drip and sinus pressure.   Eyes: Negative for redness and visual disturbance.  Respiratory: Positive for cough, shortness of breath and wheezing. Negative for stridor.   Gastrointestinal: Negative for abdominal pain and abdominal distention.  Genitourinary: Negative for dysuria, frequency and menstrual problem.  Musculoskeletal: Negative for myalgias, joint swelling and arthralgias.  Skin: Negative for rash and wound.  Neurological: Negative for dizziness, weakness and headaches.  Hematological: Negative for adenopathy. Does not bruise/bleed easily.  Psychiatric/Behavioral: Negative for sleep disturbance and decreased concentration.       Objective:   Physical Exam  Constitutional: She is oriented to person, place, and time. She appears well-developed and well-nourished. No distress.  HENT:  Head: Normocephalic and atraumatic.       Physical examination reveals swelling of the nasal turbinates marked erythema and posterior costal  Eyes:  Conjunctivae and EOM are normal. Pupils are equal, round, and reactive to light.  Neck: Normal range of motion. Neck supple. No JVD present. No tracheal deviation present. No thyromegaly present.  Cardiovascular: Normal rate, regular rhythm, normal heart sounds and intact distal pulses.   No murmur heard. Pulmonary/Chest: Effort normal and breath sounds normal. She has no wheezes. She exhibits no tenderness.       No thrills or wheezing heard on her examination she does have marked upper respiratory tract congestion  Abdominal: Soft. Bowel sounds are normal.  Musculoskeletal: Normal range of motion. She exhibits no edema and no tenderness.  Lymphadenopathy:    She has no cervical adenopathy.  Neurological: She is alert and oriented to person, place, and time. She has normal reflexes. No cranial nerve deficit.  Skin: Skin is warm and dry. She is not diaphoretic.  Psychiatric: She has a normal mood and affect. Her behavior is normal.          Assessment & Plan:  Most probably this represents a case of walking pneumonia given her response to the antibiotic therapy it would suggest that there was a bacterial etiology.  I believe that it was complicated by bronchospasm and asthmatic reaction. She was given a course of steroids with some improvement we'll continue her with her rescue inhaler because of her intermittent wheezing and cough medications she will complete an additional course of Levaquin.

## 2012-02-22 NOTE — Telephone Encounter (Signed)
Pt called and said that Dr Lovell Sheehan dx pt with walking pneumonia. Pt was prescribed abx, steroid, hydrocodone syrup and nasonex. Pt said that she is not feeling any better and its been 3 wks. Pt is needing a stronger [facility] [address], that will get rid of the congestion. Pt is running a low grade fever.

## 2012-02-22 NOTE — Telephone Encounter (Signed)
Also wants a work note for the rest of the week

## 2012-02-22 NOTE — Patient Instructions (Signed)
The patient is instructed to continue all medications as prescribed. Schedule followup with check out clerk upon leaving the clinic  

## 2012-02-23 NOTE — Telephone Encounter (Signed)
See if she can be seen by pulmonary ASAP Ok for work note

## 2012-02-23 NOTE — Telephone Encounter (Signed)
Pt is feeling better and she is going to give it the weekend.  She will Monday if she needs referral

## 2012-02-29 ENCOUNTER — Telehealth: Payer: Self-pay | Admitting: *Deleted

## 2012-02-29 DIAGNOSIS — R05 Cough: Secondary | ICD-10-CM

## 2012-02-29 NOTE — Telephone Encounter (Signed)
Per md note- referral to pulmonary

## 2012-02-29 NOTE — Telephone Encounter (Signed)
Pt. Is calling complaining of low grade fever, cough is a little better, but is just feelng "ill'.  Could not afford Nasonex.  Asking for advice.

## 2012-03-15 ENCOUNTER — Institutional Professional Consult (permissible substitution): Payer: BC Managed Care – PPO | Admitting: Pulmonary Disease

## 2012-03-25 LAB — HM COLONOSCOPY

## 2012-08-15 ENCOUNTER — Other Ambulatory Visit (INDEPENDENT_AMBULATORY_CARE_PROVIDER_SITE_OTHER): Payer: BC Managed Care – PPO

## 2012-08-15 DIAGNOSIS — Z0000 Encounter for general adult medical examination without abnormal findings: Secondary | ICD-10-CM

## 2012-08-15 LAB — HEPATIC FUNCTION PANEL
AST: 20 U/L (ref 0–37)
Albumin: 4 g/dL (ref 3.5–5.2)
Alkaline Phosphatase: 77 U/L (ref 39–117)

## 2012-08-15 LAB — CBC WITH DIFFERENTIAL/PLATELET
Basophils Absolute: 0.1 10*3/uL (ref 0.0–0.1)
Basophils Relative: 1.2 % (ref 0.0–3.0)
Hemoglobin: 12.7 g/dL (ref 12.0–15.0)
Lymphocytes Relative: 30.4 % (ref 12.0–46.0)
Monocytes Relative: 6 % (ref 3.0–12.0)
Neutro Abs: 3.4 10*3/uL (ref 1.4–7.7)
RBC: 4.21 Mil/uL (ref 3.87–5.11)
RDW: 12.4 % (ref 11.5–14.6)
WBC: 6 10*3/uL (ref 4.5–10.5)

## 2012-08-15 LAB — BASIC METABOLIC PANEL
Calcium: 8.6 mg/dL (ref 8.4–10.5)
GFR: 69.67 mL/min (ref >60.00)
Sodium: 140 mEq/L (ref 135–145)

## 2012-08-15 LAB — POCT URINALYSIS DIPSTICK
Glucose, UA: NEGATIVE
Spec Grav, UA: 1.02
Urobilinogen, UA: 0.2

## 2012-08-15 LAB — LIPID PANEL
HDL: 59.7 mg/dL (ref >39.00)
LDL Cholesterol: 113 mg/dL — ABNORMAL HIGH (ref 0–99)
Total CHOL/HDL Ratio: 3
Triglycerides: 98 mg/dL (ref 0.0–149.0)
VLDL: 19.6 mg/dL (ref 0.0–40.0)

## 2012-08-22 ENCOUNTER — Encounter: Payer: BC Managed Care – PPO | Admitting: Internal Medicine

## 2012-09-04 ENCOUNTER — Ambulatory Visit (INDEPENDENT_AMBULATORY_CARE_PROVIDER_SITE_OTHER): Payer: BC Managed Care – PPO | Admitting: Internal Medicine

## 2012-09-04 VITALS — BP 140/80 | HR 76 | Temp 98.2°F | Resp 16 | Ht 68.0 in | Wt 226.0 lb

## 2012-09-04 DIAGNOSIS — I10 Essential (primary) hypertension: Secondary | ICD-10-CM

## 2012-09-04 DIAGNOSIS — Z23 Encounter for immunization: Secondary | ICD-10-CM

## 2012-09-04 DIAGNOSIS — E785 Hyperlipidemia, unspecified: Secondary | ICD-10-CM

## 2012-09-04 DIAGNOSIS — Z0000 Encounter for general adult medical examination without abnormal findings: Secondary | ICD-10-CM

## 2012-09-04 DIAGNOSIS — E669 Obesity, unspecified: Secondary | ICD-10-CM

## 2012-09-04 NOTE — Progress Notes (Signed)
Subjective:    Patient ID: Taylor Morales, female    DOB: 04/05/1956, 56 y.o.   MRN: [id number]  HPI Patient is a 56 year old female who presents for her yearly physical examination. She has chronic medical problems of obesity hypertension and hyperlipidemia. She has gained weight and her lipids are not in control her current medications. She takes Lipitor 10 mg by mouth daily in the combination form with amlodipine   Review of Systems  Constitutional: Negative for activity change, appetite change and fatigue.  HENT: Negative for ear pain, congestion, neck pain, postnasal drip and sinus pressure.   Eyes: Negative for redness and visual disturbance.  Respiratory: Negative for cough, shortness of breath and wheezing.   Gastrointestinal: Negative for abdominal pain and abdominal distention.  Genitourinary: Negative for dysuria, frequency and menstrual problem.  Musculoskeletal: Negative for myalgias, joint swelling and arthralgias.  Skin: Negative for rash and wound.  Neurological: Negative for dizziness, weakness and headaches.  Hematological: Negative for adenopathy. Does not bruise/bleed easily.  Psychiatric/Behavioral: Negative for disturbed wake/sleep cycle and decreased concentration.   Past Medical History  Diagnosis Date  . Allergy     rhinitis  . Depression   . Hyperlipidemia   . Hypertension     History   Social History  . Marital Status: Married    Spouse Name: N/A    Number of Children: N/A  . Years of Education: N/A   Occupational History  . Not on file.   Social History Main Topics  . Smoking status: Never Smoker   . Smokeless tobacco: Not on file  . Alcohol Use:   . Drug Use:   . Sexually Active:    Other Topics Concern  . Not on file   Social History Narrative  . No narrative on file    No past surgical history on file.  No family history on file.  Not on File  Current Outpatient Prescriptions on File Prior to Visit  Medication Sig Dispense  Refill  . amlodipine-atorvastatin (CADUET) 2.5-10 MG per tablet Take 1 tablet by mouth daily.  90 tablet  3  . Azelastine HCl (ASTEPRO) 0.15 % SOLN Place 1 spray into the nose 2 (two) times daily. One spray into nose bid  90 mL  3  . FLUoxetine (PROZAC) 40 MG capsule Take 1 capsule (40 mg total) by mouth daily.  90 capsule  3  . hydrochlorothiazide (MICROZIDE) 12.5 MG capsule Take 1 capsule (12.5 mg total) by mouth daily.  90 capsule  3  . KRILL OIL 1000 MG CAPS Take by mouth. Take two by mouth bid       . mometasone (NASONEX) 50 MCG/ACT nasal spray Place 2 sprays into the nose daily.  17 g  12  . valACYclovir (VALTREX) 500 MG tablet Take 500 mg by mouth. Take 1 once a day prn       . DISCONTD: fluticasone (VERAMYST) 27.5 MCG/SPRAY nasal spray Place 1 spray into the nose 2 (two) times daily.  30 g  3    BP 140/80  Pulse 76  Temp 98.2 F (36.8 C)  Resp 16  Ht 5\' 8"  (1.727 m)  Wt 226 lb (102.513 kg)  BMI 34.36 kg/m2       Objective:   Physical Exam  Constitutional: She is oriented to person, place, and time. She appears well-developed and well-nourished. No distress.  HENT:  Head: Normocephalic and atraumatic.  Right Ear: External ear normal.  Left Ear: External ear normal.  Nose:  Nose normal.  Mouth/Throat: Oropharynx is clear and moist.  Eyes: Conjunctivae normal and EOM are normal. Pupils are equal, round, and reactive to light.  Neck: Normal range of motion. Neck supple. No JVD present. No tracheal deviation present. No thyromegaly present.  Cardiovascular: Normal rate, regular rhythm, normal heart sounds and intact distal pulses.   No murmur heard. Pulmonary/Chest: Effort normal and breath sounds normal. She has no wheezes. She exhibits no tenderness.  Abdominal: Soft. Bowel sounds are normal.  Musculoskeletal: Normal range of motion. She exhibits no edema and no tenderness.  Lymphadenopathy:    She has no cervical adenopathy.  Neurological: She is alert and oriented to  person, place, and time. She has normal reflexes. No cranial nerve deficit.  Skin: Skin is warm and dry. She is not diaphoretic.  Psychiatric: She has a normal mood and affect. Her behavior is normal.          Assessment & Plan:   This is a routine physical examination for this healthy  Female. Reviewed all health maintenance protocols including mammography colonoscopy bone density and reviewed appropriate screening labs. Her immunization history was reviewed as well as her current medications and allergies refills of her chronic medications were given and the plan for yearly health maintenance was discussed all orders and referrals were made as appropriate.  Her cholesterol control and discussed the fact that the weight gain and improper diet has influence her triglycerides levels. As well as the fact that her age the cholesterol is low because of inactivity and lack of exercise.  We discussed ways for weight loss including the use of a gluten-free diet and gave her copy of a diet

## 2012-09-05 ENCOUNTER — Encounter: Payer: Self-pay | Admitting: Internal Medicine

## 2012-12-13 ENCOUNTER — Telehealth: Payer: Self-pay | Admitting: Internal Medicine

## 2012-12-13 MED ORDER — AZITHROMYCIN 250 MG PO TABS: ORAL_TABLET | ORAL | Status: DC

## 2012-12-13 NOTE — Telephone Encounter (Signed)
Attempted to return call, but no answer; message left on VM

## 2012-12-13 NOTE — Telephone Encounter (Signed)
Pt would like MD to call in a ZPAC. (Pt has chronic sinusitis.)   Pharm [facility] [address].

## 2012-12-13 NOTE — Telephone Encounter (Signed)
Per dr jenkins-may have z pack 

## 2012-12-20 ENCOUNTER — Other Ambulatory Visit: Payer: Self-pay | Admitting: Internal Medicine

## 2012-12-21 ENCOUNTER — Telehealth: Payer: Self-pay | Admitting: Internal Medicine

## 2012-12-21 NOTE — Telephone Encounter (Signed)
Talked with pt and told her z pack continues to work after completed and to take some mucinex with guafenesin- she will and instructed to call next week if not better

## 2012-12-21 NOTE — Telephone Encounter (Signed)
Pt feels like she needs another ZPAC. Pt states she is still coughing up mucas. Alsp pt has pain and pressure above and below right eye. Pt would like to know if there is anything else she could take for the sinuses. [facility] [address]

## 2013-03-05 ENCOUNTER — Encounter: Payer: Self-pay | Admitting: Internal Medicine

## 2013-03-05 ENCOUNTER — Ambulatory Visit (INDEPENDENT_AMBULATORY_CARE_PROVIDER_SITE_OTHER): Payer: BC Managed Care – PPO | Admitting: Internal Medicine

## 2013-03-05 VITALS — BP 154/100 | HR 84 | Temp 98.3°F | Resp 16 | Ht 68.0 in | Wt 230.0 lb

## 2013-03-05 DIAGNOSIS — I10 Essential (primary) hypertension: Secondary | ICD-10-CM

## 2013-03-05 DIAGNOSIS — E785 Hyperlipidemia, unspecified: Secondary | ICD-10-CM

## 2013-03-05 MED ORDER — AMLODIPINE-ATORVASTATIN 5-10 MG PO TABS: 1.0000 | ORAL_TABLET | Freq: Every day | ORAL | Status: DC

## 2013-03-05 NOTE — Patient Instructions (Addendum)
[employer]ale[employer] of gluten free  Weight watchers  Shake system for initial weight loss but you need a sustainable strategy

## 2013-03-05 NOTE — Progress Notes (Signed)
Subjective:    Patient ID: Taylor Morales, female    DOB: 1956-01-01, 57 y.o.   MRN: [id number]  HPIthe patient is detected moderate elevation in blood pressure her blood pressure was 154/100 today and she is noted elevated blood pressure the blood pressure increases multifactorial she has long-standing hypertension, weight gain Creased risk of postmenopausal status We will increase her blood pressure medication to compensate for blood pressure and encourage however weight loss and exercise is the primary intervention    Review of Systems  Constitutional: Negative for activity change, appetite change and fatigue.  HENT: Negative for ear pain, congestion, neck pain, postnasal drip and sinus pressure.   Eyes: Negative for redness and visual disturbance.  Respiratory: Negative for cough, shortness of breath and wheezing.   Gastrointestinal: Negative for abdominal pain and abdominal distention.  Genitourinary: Negative for dysuria, frequency and menstrual problem.  Musculoskeletal: Negative for myalgias, joint swelling and arthralgias.  Skin: Negative for rash and wound.  Neurological: Negative for dizziness, weakness and headaches.  Hematological: Negative for adenopathy. Does not bruise/bleed easily.  Psychiatric/Behavioral: Negative for sleep disturbance and decreased concentration.   Past Medical History  Diagnosis Date  . Allergy     rhinitis  . Depression   . Hyperlipidemia   . Hypertension     History   Social History  . Marital Status: Married    Spouse Name: N/A    Number of Children: N/A  . Years of Education: N/A   Occupational History  . Not on file.   Social History Main Topics  . Smoking status: Never Smoker   . Smokeless tobacco: Not on file  . Alcohol Use:   . Drug Use:   . Sexually Active:    Other Topics Concern  . Not on file   Social History Narrative  . No narrative on file    History reviewed. No pertinent past surgical history.  No family  history on file.  Not on File  Current Outpatient Prescriptions on File Prior to Visit  Medication Sig Dispense Refill  . FLUoxetine (PROZAC) 40 MG capsule TAKE 1 CAPSULE DAILY  90 capsule  3  . hydrochlorothiazide (MICROZIDE) 12.5 MG capsule TAKE 1 CAPSULE DAILY  90 capsule  3  . valACYclovir (VALTREX) 500 MG tablet Take 500 mg by mouth. Take 1 once a day prn       . [DISCONTINUED] fluticasone (VERAMYST) 27.5 MCG/SPRAY nasal spray Place 1 spray into the nose 2 (two) times daily.  30 g  3   No current facility-administered medications on file prior to visit.    BP 154/100  Pulse 84  Temp(Src) 98.3 F (36.8 C)  Resp 16  Ht 5\' 8"  (1.727 m)  Wt 230 lb (104.327 kg)  BMI 34.98 kg/m2       Objective:   Physical Exam  Nursing note and vitals reviewed. Constitutional: She appears well-developed and well-nourished. No distress.  HENT:  Head: Normocephalic and atraumatic.  Eyes: Conjunctivae and EOM are normal. Pupils are equal, round, and reactive to light.  Neck: Normal range of motion. Neck supple. No JVD present. No tracheal deviation present. No thyromegaly present.  Cardiovascular: Normal rate and regular rhythm.   No murmur heard. Pulmonary/Chest: Effort normal and breath sounds normal. She has no wheezes. She exhibits no tenderness.  Abdominal: Soft. Bowel sounds are normal.  Musculoskeletal: Normal range of motion. She exhibits no edema and no tenderness.  Lymphadenopathy:    She has no cervical adenopathy.  Neurological:  She has normal reflexes. No cranial nerve deficit.  Skin: She is not diaphoretic.          Assessment & Plan:  Increase the Caduet to 5/10 one daily and monitor blood pressure on this medication increase.  Encourage weight loss and exercise  Discussed stage of HTN and need for weight reduction  BMI is 35 and need to be below 30 Goal is 205 Return OV if weight not down A1c and bmet needed

## 2013-06-11 ENCOUNTER — Encounter: Payer: Self-pay | Admitting: Internal Medicine

## 2013-07-16 ENCOUNTER — Ambulatory Visit: Payer: BC Managed Care – PPO | Admitting: Internal Medicine

## 2013-11-20 ENCOUNTER — Other Ambulatory Visit: Payer: BC Managed Care – PPO

## 2013-11-30 ENCOUNTER — Encounter: Payer: BC Managed Care – PPO | Admitting: Internal Medicine

## 2014-01-29 ENCOUNTER — Other Ambulatory Visit: Payer: Self-pay | Admitting: Internal Medicine

## 2014-02-01 ENCOUNTER — Telehealth: Payer: Self-pay | Admitting: Internal Medicine

## 2014-02-01 MED ORDER — VALACYCLOVIR HCL 500 MG PO TABS: 500.0000 mg | ORAL_TABLET | Freq: Every day | ORAL | Status: DC

## 2014-02-01 NOTE — Telephone Encounter (Signed)
Pt has outbreak and needs new rx valtrex 500 mg [facility] [address]

## 2014-02-01 NOTE — Telephone Encounter (Signed)
rx sent in electronically 

## 2014-03-19 ENCOUNTER — Other Ambulatory Visit (INDEPENDENT_AMBULATORY_CARE_PROVIDER_SITE_OTHER): Payer: BC Managed Care – PPO

## 2014-03-19 DIAGNOSIS — Z0000 Encounter for general adult medical examination without abnormal findings: Secondary | ICD-10-CM

## 2014-03-19 LAB — POCT URINALYSIS DIPSTICK
Bilirubin, UA: NEGATIVE
GLUCOSE UA: NEGATIVE
KETONES UA: NEGATIVE
Nitrite, UA: NEGATIVE
Protein, UA: NEGATIVE
SPEC GRAV UA: 1.015
UROBILINOGEN UA: 0.2
pH, UA: 7

## 2014-03-19 LAB — CBC WITH DIFFERENTIAL/PLATELET
BASOS PCT: 1.3 % (ref 0.0–3.0)
Basophils Absolute: 0.1 10*3/uL (ref 0.0–0.1)
EOS PCT: 6.5 % — AB (ref 0.0–5.0)
Eosinophils Absolute: 0.5 10*3/uL (ref 0.0–0.7)
HCT: 39.1 % (ref 36.0–46.0)
Hemoglobin: 13.4 g/dL (ref 12.0–15.0)
LYMPHS PCT: 29.8 % (ref 12.0–46.0)
Lymphs Abs: 2.1 10*3/uL (ref 0.7–4.0)
MCHC: 34.1 g/dL (ref 30.0–36.0)
MCV: 91.6 fl (ref 78.0–100.0)
MONOS PCT: 5.7 % (ref 3.0–12.0)
Monocytes Absolute: 0.4 10*3/uL (ref 0.1–1.0)
NEUTROS PCT: 56.7 % (ref 43.0–77.0)
Neutro Abs: 4.1 10*3/uL (ref 1.4–7.7)
PLATELETS: 303 10*3/uL (ref 150.0–400.0)
RBC: 4.27 Mil/uL (ref 3.87–5.11)
RDW: 12.8 % (ref 11.5–14.6)
WBC: 7.2 10*3/uL (ref 4.5–10.5)

## 2014-03-19 LAB — BASIC METABOLIC PANEL
BUN: 14 mg/dL (ref 6–23)
CALCIUM: 9.2 mg/dL (ref 8.4–10.5)
CHLORIDE: 106 meq/L (ref 96–112)
CO2: 26 mEq/L (ref 19–32)
CREATININE: 0.7 mg/dL (ref 0.4–1.2)
GFR: 85.72 mL/min (ref >60.00)
Glucose, Bld: 96 mg/dL (ref 70–99)
Potassium: 4 mEq/L (ref 3.5–5.1)
Sodium: 141 mEq/L (ref 135–145)

## 2014-03-19 LAB — LIPID PANEL
CHOLESTEROL: 185 mg/dL (ref 0–200)
HDL: 67.8 mg/dL (ref >39.00)
LDL CALC: 88 mg/dL (ref 0–99)
TRIGLYCERIDES: 148 mg/dL (ref 0.0–149.0)
Total CHOL/HDL Ratio: 3
VLDL: 29.6 mg/dL (ref 0.0–40.0)

## 2014-03-19 LAB — HEPATIC FUNCTION PANEL
ALBUMIN: 3.9 g/dL (ref 3.5–5.2)
ALT: 23 U/L (ref 0–35)
AST: 21 U/L (ref 0–37)
Alkaline Phosphatase: 82 U/L (ref 39–117)
BILIRUBIN DIRECT: 0 mg/dL (ref 0.0–0.3)
Total Bilirubin: 0.6 mg/dL (ref 0.3–1.2)
Total Protein: 7.3 g/dL (ref 6.0–8.3)

## 2014-03-19 LAB — TSH: TSH: 0.98 u[IU]/mL (ref 0.35–5.50)

## 2014-03-25 ENCOUNTER — Encounter: Payer: Self-pay | Admitting: Internal Medicine

## 2014-03-25 ENCOUNTER — Ambulatory Visit (INDEPENDENT_AMBULATORY_CARE_PROVIDER_SITE_OTHER): Payer: BC Managed Care – PPO | Admitting: Internal Medicine

## 2014-03-25 VITALS — BP 150/90 | HR 75 | Temp 97.9°F | Ht 68.5 in | Wt 227.0 lb

## 2014-03-25 DIAGNOSIS — I10 Essential (primary) hypertension: Secondary | ICD-10-CM

## 2014-03-25 DIAGNOSIS — Z0000 Encounter for general adult medical examination without abnormal findings: Secondary | ICD-10-CM

## 2014-03-25 NOTE — Progress Notes (Signed)
Subjective:    Patient ID: Taylor Morales, female    DOB: February 23, 1956, 58 y.o.   MRN: [id number]  HPI CPX Weight Blood pressure is stable    Review of Systems  Constitutional: Negative for activity change, appetite change and fatigue.  HENT: Negative for congestion, ear pain, postnasal drip and sinus pressure.   Eyes: Negative for redness and visual disturbance.  Respiratory: Negative for cough, shortness of breath and wheezing.   Gastrointestinal: Negative for abdominal pain and abdominal distention.  Genitourinary: Negative for dysuria, frequency and menstrual problem.  Musculoskeletal: Negative for arthralgias, joint swelling, myalgias and neck pain.  Skin: Negative for rash and wound.  Neurological: Negative for dizziness, weakness and headaches.  Hematological: Negative for adenopathy. Does not bruise/bleed easily.  Psychiatric/Behavioral: Negative for sleep disturbance and decreased concentration.   Past Medical History  Diagnosis Date  . Allergy     rhinitis  . Depression   . Hyperlipidemia   . Hypertension     History   Social History  . Marital Status: Married    Spouse Name: N/A    Number of Children: N/A  . Years of Education: N/A   Occupational History  . Not on file.   Social History Main Topics  . Smoking status: Never Smoker   . Smokeless tobacco: Not on file  . Alcohol Use:   . Drug Use:   . Sexual Activity:    Other Topics Concern  . Not on file   Social History Narrative  . No narrative on file    No past surgical history on file.  No family history on file.  Not on File  Current Outpatient Prescriptions on File Prior to Visit  Medication Sig Dispense Refill  . amLODipine-atorvastatin (CADUET) 5-10 MG per tablet Take 1 tablet by mouth daily.  90 tablet  3  . FLUoxetine (PROZAC) 40 MG capsule TAKE 1 CAPSULE DAILY  90 capsule  3  . hydrochlorothiazide (MICROZIDE) 12.5 MG capsule TAKE 1 CAPSULE DAILY  90 capsule  3  . valACYclovir  (VALTREX) 500 MG tablet Take 1 tablet (500 mg total) by mouth daily. Take 1 once a day prn  30 tablet  0  . [DISCONTINUED] fluticasone (VERAMYST) 27.5 MCG/SPRAY nasal spray Place 1 spray into the nose 2 (two) times daily.  30 g  3   No current facility-administered medications on file prior to visit.    BP 150/90  Pulse 75  Temp(Src) 97.9 F (36.6 C) (Oral)  Ht 5' 8.5" (1.74 m)  Wt 227 lb (102.967 kg)  BMI 34.01 kg/m2  SpO2 94%       Objective:   Physical Exam  Constitutional: She is oriented to person, place, and time. She appears well-developed and well-nourished. No distress.  Obese   HENT:  Head: Normocephalic and atraumatic.  Eyes: Conjunctivae and EOM are normal. Pupils are equal, round, and reactive to light.  Neck: Normal range of motion. Neck supple. No JVD present. No tracheal deviation present. No thyromegaly present.  Cardiovascular: Normal rate and regular rhythm.   No murmur heard. Pulmonary/Chest: Effort normal and breath sounds normal. She has no wheezes. She exhibits no tenderness.  Abdominal: Soft. Bowel sounds are normal.  Musculoskeletal: Normal range of motion. She exhibits no edema and no tenderness.  Lymphadenopathy:    She has no cervical adenopathy.  Neurological: She is alert and oriented to person, place, and time. She has normal reflexes. No cranial nerve deficit.  Skin: Skin is warm and  dry. She is not diaphoretic.  Psychiatric: She has a normal mood and affect. Her behavior is normal.          Assessment & Plan:    This is a routine wellness  examination for this patient . I reviewed all health maintenance protocols including mammography, colonoscopy, bone density Needed referrals were placed. Age and diagnosis  appropriate screening labs were ordered. Her immunization history was reviewed and appropriate vaccinations were ordered. Her current medications and allergies were reviewed and needed refills of her chronic medications were ordered.  The plan for yearly health maintenance was discussed all orders and referrals were made as appropriate.  Will refer to new MD Weight loss protocol

## 2014-03-25 NOTE — Progress Notes (Signed)
Pre visit review using our clinic review tool, if applicable. No additional management support is needed unless otherwise documented below in the visit note. 

## 2014-03-25 NOTE — Patient Instructions (Signed)
The patient is instructed to continue all medications as prescribed. Schedule followup with check out clerk upon leaving the clinic  

## 2014-03-26 ENCOUNTER — Telehealth: Payer: Self-pay | Admitting: Internal Medicine

## 2014-03-26 NOTE — Telephone Encounter (Signed)
Relevant patient education mailed to patient.  

## 2014-06-06 ENCOUNTER — Other Ambulatory Visit: Payer: Self-pay | Admitting: Internal Medicine

## 2014-07-23 ENCOUNTER — Encounter: Payer: Self-pay | Admitting: Internal Medicine

## 2014-07-26 ENCOUNTER — Telehealth: Payer: Self-pay | Admitting: Internal Medicine

## 2014-07-26 NOTE — Telephone Encounter (Signed)
Lm on vm to est w/ dr Maudie Mercury

## 2015-03-27 ENCOUNTER — Ambulatory Visit: Payer: Self-pay | Admitting: Family Medicine

## 2015-04-02 ENCOUNTER — Telehealth: Payer: Self-pay | Admitting: Internal Medicine

## 2015-04-02 NOTE — Telephone Encounter (Signed)
Pt had an appt with dr hunter on 5-27 md will not be in office. Can I create 30 min slot for this pt?

## 2015-04-02 NOTE — Telephone Encounter (Signed)
yes

## 2015-04-03 ENCOUNTER — Encounter: Payer: Self-pay | Admitting: Adult Health

## 2015-04-03 ENCOUNTER — Ambulatory Visit (INDEPENDENT_AMBULATORY_CARE_PROVIDER_SITE_OTHER): Payer: BLUE CROSS/BLUE SHIELD | Admitting: Adult Health

## 2015-04-03 VITALS — BP 134/100 | HR 82 | Temp 98.2°F | Wt 229.8 lb

## 2015-04-03 DIAGNOSIS — R6889 Other general symptoms and signs: Secondary | ICD-10-CM | POA: Diagnosis not present

## 2015-04-03 MED ORDER — HYDROCHLOROTHIAZIDE 25 MG PO TABS: 25.0000 mg | ORAL_TABLET | Freq: Every day | ORAL | Status: DC

## 2015-04-03 NOTE — Progress Notes (Signed)
Pre visit review using our clinic review tool, if applicable. No additional management support is needed unless otherwise documented below in the visit note. 

## 2015-04-03 NOTE — Progress Notes (Signed)
   Subjective:    Patient ID: Taylor Morales, female    DOB: 1956/01/06, 59 y.o.   MRN: [id number]  HPI  Patient presents to the office today for feeling lethargic, feels as though she is not thinking clearly, fever (?), feeling flushed, ankle and feet swelling and achy joints ( ankles and hips) since Saturday.   Denies N/V/D, no rashes. Does not live in a wooded area. Has two dogs which are inside.   Denies any sick contacts. No recent travel      Review of Systems  Constitutional: Positive for fever, chills, activity change and fatigue.  Gastrointestinal: Negative for nausea, vomiting, diarrhea and constipation.  Musculoskeletal: Positive for myalgias and arthralgias.  Neurological: Positive for weakness.  Psychiatric/Behavioral: Positive for decreased concentration.  All other systems reviewed and are negative.      Objective:   Physical Exam  Constitutional: She is oriented to person, place, and time. She appears well-developed and well-nourished. No distress.  HENT:  Head: Normocephalic and atraumatic.  Mouth/Throat: Oropharynx is clear and moist. No oropharyngeal exudate.  Significant cerumen in bilateral ears.   Neck: Normal range of motion. Neck supple.  Cardiovascular: Normal rate, regular rhythm and normal heart sounds.  Exam reveals no friction rub.   No murmur heard. Pulmonary/Chest: No respiratory distress. She has no wheezes. She has no rales. She exhibits no tenderness.  Musculoskeletal: She exhibits edema (bilateral lower extremities L<R) and tenderness.  Lymphadenopathy:    She has no cervical adenopathy.  Neurological: She is alert and oriented to person, place, and time.  Skin: Skin is warm and dry. No rash noted. No erythema. No pallor.          Assessment & Plan:  1. Flu-like symptoms - Rapid flu was negative - This is likely a viral condition  - Blood pressure is elevated - Will increase HCTZ to 25 mg daily  - Limit fluid intake - CBC with  Differential/Platelet - Basic metabolic panel - Urinalysis, Routine w reflex microscopic

## 2015-04-03 NOTE — Patient Instructions (Addendum)
Your rapid flu was negative. I still think this is a viral syndrome and just has to clear on it's own. Continue to rest and stay well hydrated. Take 25mg  of HCTZ for your blood pressure and start monitoring your blood pressure at home. If you are not feeling any better in the next 2-3 days please follow up with me. I hope you feel better!   Viral Infections A viral infection can be caused by different types of viruses.Most viral infections are not serious and resolve on their own. However, some infections may cause severe symptoms and may lead to further complications. SYMPTOMS Viruses can frequently cause:  Minor sore throat.  Aches and pains.  Headaches.  Runny nose.  Different types of rashes.  Watery eyes.  Tiredness.  Cough.  Loss of appetite.  Gastrointestinal infections, resulting in nausea, vomiting, and diarrhea. These symptoms do not respond to antibiotics because the infection is not caused by bacteria. However, you might catch a bacterial infection following the viral infection. This is sometimes called a "superinfection." Symptoms of such a bacterial infection may include:  Worsening sore throat with pus and difficulty swallowing.  Swollen neck glands.  Chills and a high or persistent fever.  Severe headache.  Tenderness over the sinuses.  Persistent overall ill feeling (malaise), muscle aches, and tiredness (fatigue).  Persistent cough.  Yellow, green, or brown mucus production with coughing. HOME CARE INSTRUCTIONS   Only take over-the-counter or prescription medicines for pain, discomfort, diarrhea, or fever as directed by your caregiver.  Drink enough water and fluids to keep your urine clear or pale yellow. Sports drinks can provide valuable electrolytes, sugars, and hydration.  Get plenty of rest and maintain proper nutrition. Soups and broths with crackers or rice are fine. SEEK IMMEDIATE MEDICAL CARE IF:   You have severe headaches, shortness  of breath, chest pain, neck pain, or an unusual rash.  You have uncontrolled vomiting, diarrhea, or you are unable to keep down fluids.  You or your child has an oral temperature above 102 F (38.9 C), not controlled by medicine.  Your baby is older than 3 months with a rectal temperature of 102 F (38.9 C) or higher.  Your baby is 33 months old or younger with a rectal temperature of 100.4 F (38 C) or higher. MAKE SURE YOU:   Understand these instructions.  Will watch your condition.  Will get help right away if you are not doing well or get worse. Document Released: 08/25/2005 Document Revised: 02/07/2012 Document Reviewed: 03/22/2011 [facility] Patient Information 2015 [place], [place]. This information is not intended to replace advice given to you by your health care provider. Make sure you discuss any questions you have with your health care provider.

## 2015-04-03 NOTE — Telephone Encounter (Signed)
Pt has been sch

## 2015-04-04 LAB — URINALYSIS, ROUTINE W REFLEX MICROSCOPIC
Bilirubin Urine: NEGATIVE
KETONES UR: NEGATIVE
Nitrite: NEGATIVE
SPECIFIC GRAVITY, URINE: 1.01 (ref 1.000–1.030)
Total Protein, Urine: NEGATIVE
URINE GLUCOSE: NEGATIVE
UROBILINOGEN UA: 0.2 (ref 0.0–1.0)
pH: 6 (ref 5.0–8.0)

## 2015-04-04 LAB — BASIC METABOLIC PANEL
BUN: 15 mg/dL (ref 6–23)
CHLORIDE: 102 meq/L (ref 96–112)
CO2: 24 mEq/L (ref 19–32)
Calcium: 9.7 mg/dL (ref 8.4–10.5)
Creatinine, Ser: 0.86 mg/dL (ref 0.40–1.20)
GFR: 71.81 mL/min (ref >60.00)
Glucose, Bld: 92 mg/dL (ref 70–99)
POTASSIUM: 3.5 meq/L (ref 3.5–5.1)
SODIUM: 138 meq/L (ref 135–145)

## 2015-04-04 LAB — CBC WITH DIFFERENTIAL/PLATELET
Basophils Absolute: 0 10*3/uL (ref 0.0–0.1)
Basophils Relative: 0.5 % (ref 0.0–3.0)
Eosinophils Absolute: 0.4 10*3/uL (ref 0.0–0.7)
Eosinophils Relative: 4.5 % (ref 0.0–5.0)
HCT: 39.5 % (ref 36.0–46.0)
HEMOGLOBIN: 13.6 g/dL (ref 12.0–15.0)
LYMPHS PCT: 31.4 % (ref 12.0–46.0)
Lymphs Abs: 2.7 10*3/uL (ref 0.7–4.0)
MCHC: 34.3 g/dL (ref 30.0–36.0)
MCV: 89.9 fl (ref 78.0–100.0)
MONO ABS: 0.3 10*3/uL (ref 0.1–1.0)
Monocytes Relative: 3.9 % (ref 3.0–12.0)
Neutro Abs: 5.1 10*3/uL (ref 1.4–7.7)
Neutrophils Relative %: 59.7 % (ref 43.0–77.0)
Platelets: 357 10*3/uL (ref 150.0–400.0)
RBC: 4.4 Mil/uL (ref 3.87–5.11)
RDW: 12.6 % (ref 11.5–15.5)
WBC: 8.6 10*3/uL (ref 4.0–10.5)

## 2015-04-07 LAB — POCT INFLUENZA A/B
INFLUENZA B, POC: NEGATIVE
Influenza A, POC: NEGATIVE

## 2015-04-07 NOTE — Addendum Note (Signed)
Addended by: Colleen Can on: 04/07/2015 01:46 PM   Modules accepted: Orders

## 2015-04-08 ENCOUNTER — Telehealth: Payer: Self-pay | Admitting: Internal Medicine

## 2015-04-08 MED ORDER — AMLODIPINE-ATORVASTATIN 5-10 MG PO TABS: 1.0000 | ORAL_TABLET | Freq: Every day | ORAL | Status: DC

## 2015-04-08 NOTE — Telephone Encounter (Signed)
Pt saw cory last week. Has appt w/ dr hunter 5/18 Pt request refill amLODipine-atorvastatin (CADUET) 5-10 MG per tablet pt is out so needs 30 day sent to local pharm   [facility]

## 2015-04-08 NOTE — Telephone Encounter (Signed)
rx sent in electronically 

## 2015-04-16 ENCOUNTER — Ambulatory Visit (INDEPENDENT_AMBULATORY_CARE_PROVIDER_SITE_OTHER): Payer: BLUE CROSS/BLUE SHIELD | Admitting: Family Medicine

## 2015-04-16 ENCOUNTER — Encounter: Payer: Self-pay | Admitting: Family Medicine

## 2015-04-16 VITALS — BP 140/100 | HR 80 | Temp 98.5°F | Wt 232.0 lb

## 2015-04-16 DIAGNOSIS — Z87891 Personal history of nicotine dependence: Secondary | ICD-10-CM

## 2015-04-16 DIAGNOSIS — I10 Essential (primary) hypertension: Secondary | ICD-10-CM | POA: Diagnosis not present

## 2015-04-16 DIAGNOSIS — E669 Obesity, unspecified: Secondary | ICD-10-CM

## 2015-04-16 DIAGNOSIS — F1020 Alcohol dependence, uncomplicated: Secondary | ICD-10-CM

## 2015-04-16 DIAGNOSIS — Z7251 High risk heterosexual behavior: Secondary | ICD-10-CM | POA: Diagnosis not present

## 2015-04-16 DIAGNOSIS — E785 Hyperlipidemia, unspecified: Secondary | ICD-10-CM

## 2015-04-16 LAB — COMPREHENSIVE METABOLIC PANEL
ALK PHOS: 81 U/L (ref 39–117)
ALT: 21 U/L (ref 0–35)
AST: 22 U/L (ref 0–37)
Albumin: 4.4 g/dL (ref 3.5–5.2)
BILIRUBIN TOTAL: 0.4 mg/dL (ref 0.2–1.2)
BUN: 14 mg/dL (ref 6–23)
CO2: 26 mEq/L (ref 19–32)
Calcium: 9.5 mg/dL (ref 8.4–10.5)
Chloride: 105 mEq/L (ref 96–112)
Creatinine, Ser: 0.81 mg/dL (ref 0.40–1.20)
GFR: 76.94 mL/min (ref >60.00)
Glucose, Bld: 107 mg/dL — ABNORMAL HIGH (ref 70–99)
Potassium: 4.1 mEq/L (ref 3.5–5.1)
SODIUM: 138 meq/L (ref 135–145)
TOTAL PROTEIN: 7.6 g/dL (ref 6.0–8.3)

## 2015-04-16 LAB — CBC
HCT: 39.3 % (ref 36.0–46.0)
Hemoglobin: 13.6 g/dL (ref 12.0–15.0)
MCHC: 34.7 g/dL (ref 30.0–36.0)
MCV: 89.3 fl (ref 78.0–100.0)
Platelets: 315 10*3/uL (ref 150.0–400.0)
RBC: 4.4 Mil/uL (ref 3.87–5.11)
RDW: 12.7 % (ref 11.5–15.5)
WBC: 6.9 10*3/uL (ref 4.0–10.5)

## 2015-04-16 LAB — LIPID PANEL
CHOL/HDL RATIO: 3
Cholesterol: 193 mg/dL (ref 0–200)
HDL: 75.1 mg/dL (ref >39.00)
LDL CALC: 97 mg/dL (ref 0–99)
NONHDL: 117.9
Triglycerides: 104 mg/dL (ref 0.0–149.0)
VLDL: 20.8 mg/dL (ref 0.0–40.0)

## 2015-04-16 LAB — URINALYSIS, MICROSCOPIC ONLY: WBC, UA: NONE SEEN (ref 0–?)

## 2015-04-16 LAB — POCT URINALYSIS DIPSTICK
BILIRUBIN UA: NEGATIVE
Glucose, UA: NEGATIVE
Ketones, UA: NEGATIVE
NITRITE UA: NEGATIVE
PH UA: 7
Protein, UA: NEGATIVE
SPEC GRAV UA: 1.015
Urobilinogen, UA: 0.2

## 2015-04-16 LAB — TSH: TSH: 1.53 u[IU]/mL (ref 0.35–4.50)

## 2015-04-16 MED ORDER — LOSARTAN POTASSIUM-HCTZ 100-25 MG PO TABS: 1.0000 | ORAL_TABLET | Freq: Every day | ORAL | Status: DC

## 2015-04-16 NOTE — Progress Notes (Signed)
Taylor Reddish, MD Phone: [phone]  Subjective:  Patient presents today to establish care with me as their new primary care provider. Patient was formerly a patient of Dr. Arnoldo Morale. Chief complaint-noted.   Hypertension-poor control   BP Readings from Last 3 Encounters:  04/16/15 140/100  04/03/15 134/100  03/25/14 150/90   Home BP monitoring-no Swim class 3x a week, weight not moving Compliant with medications-yes without side effects except some ankle edema on amlodoipine 5mg  and hctz 25mg  ROS-Denies any CP, HA, SOB, blurry vision, LE edema recent worsening  Hyperlipidemia-controlled on atorvastatin 10mg   Lab Results  Component Value Date   LDLCALC 97 04/16/2015  Regular exercise: yes as above Diet: alcohol intake increases caloric intake ROS- no chest pain or shortness of breath. No myalgias  Alcoholism-poor control -admits she needs to drink less. Family history. Drinking small bottle of wine a night. LFTs have been ok in past.  ROS- No SI/HI  The following were reviewed and entered/updated in epic: Past Medical History  Diagnosis Date  . Allergy     rhinitis  . Depression     stress related in past, on prozac-took self off  . Hyperlipidemia   . Hypertension    Patient Active Problem List   Diagnosis Date Noted  . Alcoholism 04/17/2015    Priority: High  . Hyperlipidemia 09/29/2007    Priority: Medium  . Depression 09/29/2007    Priority: Medium  . Essential hypertension 09/29/2007    Priority: Medium  . Former smoker 04/17/2015    Priority: Low  . Obesity 02/27/2010    Priority: Low  . FOOT PAIN, RIGHT 05/16/2009    Priority: Low  . Allergic rhinitis 09/29/2007    Priority: Low   Past Surgical History  Procedure Laterality Date  . Cesarean section      x2  . Tonsillectomy      as child  . Nasal sinus surgery      x2  . Breast lumpectomy      benign    Family History  Problem Relation Age of Onset  . Other Father     unknown  .  Lymphoma Mother     around 62  . Breast cancer Mother     age 51  . Lymphoma Maternal Uncle   . Pancreatic cancer Mother     ? pancreatic as had whipple    Medications- reviewed and updated Current Outpatient Prescriptions  Medication Sig Dispense Refill  . amLODipine-atorvastatin (CADUET) 5-10 MG per tablet Take 1 tablet by mouth daily. 30 tablet 0  . hydrochlorothiazide (HYDRODIURIL) 25 MG tablet Take 1 tablet (25 mg total) by mouth daily. 90 tablet 3  claritin prn in past  Allergies-reviewed and updated No Known Allergies  History   Social History  . Marital Status: Married    Spouse Name: N/A  . Number of Children: N/A  . Years of Education: N/A   Social History Main Topics  . Smoking status: Former Smoker -- 1.00 packs/day for 13 years    Types: Cigarettes    Quit date: 11/29/1982  . Smokeless tobacco: Not on file  . Alcohol Use: 0.0 oz/week    0 Standard drinks or equivalent per week     Comment: bottle of wine a night  . Drug Use: No  . Sexual Activity: Not on file   Other Topics Concern  . None   Social History Narrative   Married (husband patient of Dr.Hunter). 2 children- 7 and 27 in 2016-daughter lives  in town, son in [place]. No grandkids-just granddogs.       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    Hopeful to start sailing again, former camping    ROS--See HPI   Objective: BP 140/100 mmHg  Pulse 80  Temp(Src) 98.5 F (36.9 C)  Wt 232 lb (105.235 kg) Gen: NAD, resting comfortably HEENT: Mucous membranes are moist. Oropharynx normal CV: RRR no murmurs rubs or gallops Lungs: CTAB no crackles, wheeze, rhonchi Abdomen: soft/nontender/nondistended/normal bowel sounds. No rebound or guarding. obese Ext: trace edema Skin: warm, dry, no rash  Neuro: grossly normal, moves all extremities, PERRLA   Assessment/Plan:  Essential hypertension Poor control on  amlodipine 5mg  and hctz 25 mg, concern is already has some edema so do not want to increase amlodipine so add losartan and DASH diet and follow up within a week. Cut alcohol as well.    Hyperlipidemia controlled on atorvastatin 10mg , continue   Alcoholism Poor controlled. Advised AA, patient hesitant. Will continue to counsel. Discussed at least cutting drinking in half as she is trying to lose weight and this is inhibited by excess calories.    within a month. Did not add vitamin D as patient requested to fasting labs. Consider checking on repeat-reports history of low vitamin D. Will also need CPE/PAP.   Results for orders placed or performed in visit on 04/16/15 (from the past 24 hour(s))  CBC     Status: None   Collection Time: 04/16/15 11:50 AM  Result Value Ref Range   WBC 6.9 4.0 - 10.5 K/uL   RBC 4.40 3.87 - 5.11 Mil/uL   Platelets 315.0 150.0 - 400.0 K/uL   Hemoglobin 13.6 12.0 - 15.0 g/dL   HCT 39.3 36.0 - 46.0 %   MCV 89.3 78.0 - 100.0 fl   MCHC 34.7 30.0 - 36.0 g/dL   RDW 12.7 11.5 - 15.5 %  Comprehensive metabolic panel     Status: Abnormal   Collection Time: 04/16/15 11:50 AM  Result Value Ref Range   Sodium 138 135 - 145 mEq/L   Potassium 4.1 3.5 - 5.1 mEq/L   Chloride 105 96 - 112 mEq/L   CO2 26 19 - 32 mEq/L   Glucose, Bld 107 (H) 70 - 99 mg/dL   BUN 14 6 - 23 mg/dL   Creatinine, Ser 0.81 0.40 - 1.20 mg/dL   Total Bilirubin 0.4 0.2 - 1.2 mg/dL   Alkaline Phosphatase 81 39 - 117 U/L   AST 22 0 - 37 U/L   ALT 21 0 - 35 U/L   Total Protein 7.6 6.0 - 8.3 g/dL   Albumin 4.4 3.5 - 5.2 g/dL   Calcium 9.5 8.4 - 10.5 mg/dL   GFR 76.94 >60.00 mL/min  Lipid panel     Status: None   Collection Time: 04/16/15 11:50 AM  Result Value Ref Range   Cholesterol 193 0 - 200 mg/dL   Triglycerides 104.0 0.0 - 149.0 mg/dL   HDL 75.10 >39.00 mg/dL   VLDL 20.8 0.0 - 40.0 mg/dL   LDL Cholesterol 97 0 - 99 mg/dL   Total CHOL/HDL Ratio 3    NonHDL 117.90   TSH     Status:  None   Collection Time: 04/16/15 11:50 AM  Result Value Ref Range   TSH 1.53 0.35 - 4.50 uIU/mL  HIV antibody     Status: None   Collection Time: 04/16/15 11:50  AM  Result Value Ref Range   HIV 1&2 Ab, 4th Generation NONREACTIVE NONREACTIVE   Narrative   Performed at:  [facility], [address]                [place], [place] [address]  POCT urinalysis dipstick     Status: None   Collection Time: 04/16/15  1:28 PM  Result Value Ref Range   Color, UA yellow    Clarity, UA clear    Glucose, UA n    Bilirubin, UA n    Ketones, UA n    Spec Grav, UA 1.015    Blood, UA trace lysed    pH, UA 7.0    Protein, UA n    Urobilinogen, UA 0.2    Nitrite, UA n    Leukocytes, UA Trace   Urine Microscopic     Status: None   Collection Time: 04/16/15  1:37 PM  Result Value Ref Range   WBC, UA none seen 0-2/hpf   RBC / HPF 0-2/hpf 0-2/hpf   Squamous Epithelial / LPF Rare(0-4/hpf) Rare(0-4/hpf)     Meds ordered this encounter  Medications  . losartan-hydrochlorothiazide (HYZAAR) 100-25 MG per tablet    Sig: Take 1 tablet by mouth daily.    Dispense:  90 tablet    Refill:  3

## 2015-04-16 NOTE — Patient Instructions (Signed)
Blood pressure remains high  Start losartan-hctz instead of hctz alone- still 1 pill  Continue amlodipine-atorvastatin  Dash diet below  Consider AA, at least try to cut wine in half  Fasting labs today  Follow up in 1 month for blood pressure recheck  Thrilled with your exercise  Health Maintenance Due  Topic Date Due  . HIV Screening  05/14/1971  . PAP SMEAR  08/23/2014    DASH Eating Plan DASH stands for "Dietary Approaches to Stop Hypertension." The DASH eating plan is a healthy eating plan that has been shown to reduce high blood pressure (hypertension). Additional health benefits may include reducing the risk of type 2 diabetes mellitus, heart disease, and stroke. The DASH eating plan may also help with weight loss. WHAT DO I NEED TO KNOW ABOUT THE DASH EATING PLAN? For the DASH eating plan, you will follow these general guidelines:  Choose foods with a percent daily value for sodium of less than 5% (as listed on the food label).  Use salt-free seasonings or herbs instead of table salt or sea salt.  Check with your health care provider or pharmacist before using salt substitutes.  Eat lower-sodium products, often labeled as "lower sodium" or "no salt added."  Eat fresh foods.  Eat more vegetables, fruits, and low-fat dairy products.  Choose whole grains. Look for the word "whole" as the first word in the ingredient list.  Choose fish and skinless chicken or [place] more often than red meat. Limit fish, poultry, and meat to 6 oz (170 g) each day.  Limit sweets, desserts, sugars, and sugary drinks.  Choose heart-healthy fats.  Limit cheese to 1 oz (28 g) per day.  Eat more home-cooked food and less restaurant, buffet, and fast food.  Limit fried foods.  Cook foods using methods other than frying.  Limit canned vegetables. If you do use them, rinse them well to decrease the sodium.  When eating at a restaurant, ask that your food be prepared with less salt, or  no salt if possible. WHAT FOODS CAN I EAT? Seek help from a dietitian for individual calorie needs. Grains Whole grain or whole wheat bread. Brown rice. Whole grain or whole wheat pasta. Quinoa, bulgur, and whole grain cereals. Low-sodium cereals. Corn or whole wheat flour tortillas. Whole grain cornbread. Whole grain crackers. Low-sodium crackers. Vegetables Fresh or frozen vegetables (raw, steamed, roasted, or grilled). Low-sodium or reduced-sodium tomato and vegetable juices. Low-sodium or reduced-sodium tomato sauce and paste. Low-sodium or reduced-sodium canned vegetables.  Fruits All fresh, canned (in natural juice), or frozen fruits. Meat and Other Protein Products Ground beef (85% or leaner), grass-fed beef, or beef trimmed of fat. Skinless chicken or [place]. Ground chicken or [place]. Pork trimmed of fat. All fish and seafood. Eggs. Dried beans, peas, or lentils. Unsalted nuts and seeds. Unsalted canned beans. Dairy Low-fat dairy products, such as skim or 1% milk, 2% or reduced-fat cheeses, low-fat ricotta or cottage cheese, or plain low-fat yogurt. Low-sodium or reduced-sodium cheeses. Fats and Oils Tub margarines without trans fats. Light or reduced-fat mayonnaise and salad dressings (reduced sodium). Avocado. Safflower, olive, or canola oils. Natural peanut or almond butter. Other Unsalted popcorn and pretzels. The items listed above may not be a complete list of recommended foods or beverages. Contact your dietitian for more options. WHAT FOODS ARE NOT RECOMMENDED? Grains White bread. White pasta. White rice. Refined cornbread. Bagels and croissants. Crackers that contain trans fat. Vegetables Creamed or fried vegetables. Vegetables in a cheese sauce.  Regular canned vegetables. Regular canned tomato sauce and paste. Regular tomato and vegetable juices. Fruits Dried fruits. Canned fruit in light or heavy syrup. Fruit juice. Meat and Other Protein Products Fatty cuts of meat.  Ribs, chicken wings, bacon, sausage, bologna, salami, chitterlings, fatback, hot dogs, bratwurst, and packaged luncheon meats. Salted nuts and seeds. Canned beans with salt. Dairy Whole or 2% milk, cream, half-and-half, and cream cheese. Whole-fat or sweetened yogurt. Full-fat cheeses or blue cheese. Nondairy creamers and whipped toppings. Processed cheese, cheese spreads, or cheese curds. Condiments Onion and garlic salt, seasoned salt, table salt, and sea salt. Canned and packaged gravies. Worcestershire sauce. Tartar sauce. Barbecue sauce. Teriyaki sauce. Soy sauce, including reduced sodium. Steak sauce. Fish sauce. Oyster sauce. Cocktail sauce. Horseradish. Ketchup and mustard. Meat flavorings and tenderizers. Bouillon cubes. Hot sauce. Tabasco sauce. Marinades. Taco seasonings. Relishes. Fats and Oils Butter, stick margarine, lard, shortening, ghee, and bacon fat. Coconut, palm kernel, or palm oils. Regular salad dressings. Other Pickles and olives. Salted popcorn and pretzels. The items listed above may not be a complete list of foods and beverages to avoid. Contact your dietitian for more information. WHERE CAN I FIND MORE INFORMATION? National Heart, Lung, and Blood Institute: travelstabloid.com Document Released: 11/04/2011 Document Revised: 04/01/2014 Document Reviewed: 09/19/2013 [facility] Patient Information 2015 [place], [place]. This information is not intended to replace advice given to you by your health care provider. Make sure you discuss any questions you have with your health care provider.

## 2015-04-17 DIAGNOSIS — F1020 Alcohol dependence, uncomplicated: Secondary | ICD-10-CM | POA: Insufficient documentation

## 2015-04-17 DIAGNOSIS — Z87891 Personal history of nicotine dependence: Secondary | ICD-10-CM | POA: Insufficient documentation

## 2015-04-17 LAB — HIV ANTIBODY (ROUTINE TESTING W REFLEX): HIV: NONREACTIVE

## 2015-04-17 NOTE — Assessment & Plan Note (Signed)
Poor control on amlodipine 5mg  and hctz 25 mg, concern is already has some edema so do not want to increase amlodipine so add losartan and DASH diet and follow up within a week. Cut alcohol as well.

## 2015-04-17 NOTE — Assessment & Plan Note (Signed)
Poor controlled. Advised AA, patient hesitant. Will continue to counsel. Discussed at least cutting drinking in half as she is trying to lose weight and this is inhibited by excess calories.

## 2015-04-17 NOTE — Assessment & Plan Note (Signed)
controlled on atorvastatin 10mg , continue

## 2015-04-21 ENCOUNTER — Telehealth: Payer: Self-pay | Admitting: Family Medicine

## 2015-04-21 NOTE — Telephone Encounter (Signed)
Pt needs refill on valtrex and fluoxentin call into [facility] [address]

## 2015-04-21 NOTE — Telephone Encounter (Signed)
Patient denied depressive symptoms and stated she had come off prozac. What has changed? If she now admits to depressed mood, ok restarting as long as no suicidal ideation and as long as she would call 911 if this happens and as long as she has a visit with me scheduled within a month.   In regards to valtrex, there is nothing on her problem list or history stating what this is for, is this genital warts or cold sores?does she just use intermittent treatment? i need to know these things to prepare proper rx for her.

## 2015-04-21 NOTE — Telephone Encounter (Signed)
Pt hasnt been on Prozac since 2015 and Valtrex was taken off of her list on 04/16/15, ok to refill?

## 2015-04-22 ENCOUNTER — Other Ambulatory Visit: Payer: Self-pay

## 2015-04-22 MED ORDER — FLUOXETINE HCL 40 MG PO CAPS: 40.0000 mg | ORAL_CAPSULE | Freq: Every day | ORAL | Status: DC

## 2015-04-22 MED ORDER — VALACYCLOVIR HCL 500 MG PO TABS: 500.0000 mg | ORAL_TABLET | Freq: Every day | ORAL | Status: DC | PRN

## 2015-04-22 NOTE — Telephone Encounter (Signed)
Keba thanks for refilling. Please let patient know if you did not already. Also you can tell her she can use the valtrex twice a day for 3 days at first sign of flare. I would like to see her back in about a month to check in on the depression. I still strongly advise AA.

## 2015-04-22 NOTE — Telephone Encounter (Signed)
Pt.notified

## 2015-04-22 NOTE — Telephone Encounter (Signed)
Pt states she thought about what you said after the visit regading stopping the alcohol and she has noticed that since she has been off of the Prozac she has been drinking more so she wants her "happy pills" back so she can cut back on the alcohol. She also states that she uses Valtrex for Genital Herpes, she states last Wednesday was a stressful day for her and she had a flare up.

## 2015-04-25 ENCOUNTER — Ambulatory Visit: Payer: Self-pay | Admitting: Family Medicine

## 2015-04-29 ENCOUNTER — Encounter: Payer: Self-pay | Admitting: Family Medicine

## 2016-06-28 ENCOUNTER — Other Ambulatory Visit: Payer: Self-pay | Admitting: Family Medicine

## 2016-09-13 ENCOUNTER — Telehealth: Payer: Self-pay | Admitting: Family Medicine

## 2016-09-13 NOTE — Telephone Encounter (Signed)
Fine with me. I last saw her over a year ago and asked her to follow up within a week to check in on blood pressure. Have not seen her since.

## 2016-09-13 NOTE — Telephone Encounter (Signed)
Left message for patient to call and schedule.

## 2016-09-13 NOTE — Telephone Encounter (Signed)
ok 

## 2016-09-13 NOTE — Telephone Encounter (Signed)
Patient would like to transfer care from Dr. Yong Channel to Dr Lorelei Pont. Patient is due for a CPE and request to have it done with Dr. Lorelei Pont. States she has been a [employer] patient for 20 years and all of her records are in the system so she just needs to transfer care and get a CPE. Plse adv  [phone] (work) [phone] (cell)

## 2016-09-16 NOTE — Telephone Encounter (Signed)
Can give her #10 of BP medicines and prozac but needs to be seen- I had advised over a year ago 1 week follow up

## 2016-09-16 NOTE — Telephone Encounter (Signed)
Pt has appt 12/04 with Dr Lorelei Pont, but is completeley out of her bp med. Would like a 45 day sent to   [facility] [address]  losartan-hydrochlorothiazide (HYZAAR) 100-25 MG per tablet AND FLUoxetine (PROZAC) 40 MG capsule

## 2016-09-17 ENCOUNTER — Other Ambulatory Visit: Payer: Self-pay | Admitting: Family Medicine

## 2016-09-30 ENCOUNTER — Other Ambulatory Visit: Payer: Self-pay

## 2016-09-30 MED ORDER — AMLODIPINE-ATORVASTATIN 5-10 MG PO TABS: 1.0000 | ORAL_TABLET | Freq: Every day | ORAL | 0 refills | Status: DC

## 2016-09-30 MED ORDER — FLUOXETINE HCL 40 MG PO CAPS: 40.0000 mg | ORAL_CAPSULE | Freq: Every day | ORAL | 0 refills | Status: DC

## 2016-09-30 NOTE — Telephone Encounter (Signed)
10 day prescription sent in

## 2016-11-01 ENCOUNTER — Ambulatory Visit (INDEPENDENT_AMBULATORY_CARE_PROVIDER_SITE_OTHER): Payer: BLUE CROSS/BLUE SHIELD | Admitting: Family Medicine

## 2016-11-01 ENCOUNTER — Other Ambulatory Visit: Payer: Self-pay | Admitting: Family Medicine

## 2016-11-01 VITALS — BP 142/82 | HR 86 | Temp 98.2°F | Ht 68.0 in | Wt 233.6 lb

## 2016-11-01 DIAGNOSIS — Z131 Encounter for screening for diabetes mellitus: Secondary | ICD-10-CM

## 2016-11-01 DIAGNOSIS — H6123 Impacted cerumen, bilateral: Secondary | ICD-10-CM

## 2016-11-01 DIAGNOSIS — Z119 Encounter for screening for infectious and parasitic diseases, unspecified: Secondary | ICD-10-CM | POA: Diagnosis not present

## 2016-11-01 DIAGNOSIS — F1010 Alcohol abuse, uncomplicated: Secondary | ICD-10-CM

## 2016-11-01 DIAGNOSIS — M79661 Pain in right lower leg: Secondary | ICD-10-CM

## 2016-11-01 DIAGNOSIS — I10 Essential (primary) hypertension: Secondary | ICD-10-CM | POA: Diagnosis not present

## 2016-11-01 DIAGNOSIS — E663 Overweight: Secondary | ICD-10-CM

## 2016-11-01 DIAGNOSIS — T3695XA Adverse effect of unspecified systemic antibiotic, initial encounter: Secondary | ICD-10-CM

## 2016-11-01 DIAGNOSIS — Z1231 Encounter for screening mammogram for malignant neoplasm of breast: Secondary | ICD-10-CM

## 2016-11-01 DIAGNOSIS — R7303 Prediabetes: Secondary | ICD-10-CM

## 2016-11-01 DIAGNOSIS — F331 Major depressive disorder, recurrent, moderate: Secondary | ICD-10-CM

## 2016-11-01 DIAGNOSIS — Z13220 Encounter for screening for lipoid disorders: Secondary | ICD-10-CM

## 2016-11-01 MED ORDER — FLUOXETINE HCL 20 MG PO TABS: 20.0000 mg | ORAL_TABLET | Freq: Every day | ORAL | 0 refills | Status: DC

## 2016-11-01 MED ORDER — FLUCONAZOLE 150 MG PO TABS: 150.0000 mg | ORAL_TABLET | Freq: Once | ORAL | 0 refills | Status: AC

## 2016-11-01 MED ORDER — FLUOXETINE HCL 40 MG PO CAPS: 40.0000 mg | ORAL_CAPSULE | Freq: Every day | ORAL | 3 refills | Status: DC

## 2016-11-01 NOTE — Patient Instructions (Signed)
It was very nice to see you today- we will get labs and I will be in touch with your results asap Start back on prozac 20 mg- after 1-2 weeks you can increase to 2 of the 20 mg pills, when you finish this go back on the 40 mg I agree that we need to work on cutting down on alcohol- please do this gradually over a couple of weeks.  If this is not going well please let me know!  If you do not want to abstain totally I would encourage you to work towards drinking maybe 2 days of the week.

## 2016-11-01 NOTE — Progress Notes (Signed)
Pre visit review using our clinic review tool, if applicable. No additional management support is needed unless otherwise documented below in the visit note. 

## 2016-11-01 NOTE — Progress Notes (Addendum)
[place] at [facility] [address] [place], [place] [address] [id number] [phone]  Date:  11/01/2016   Name:  Taylor Morales   DOB:  01-02-1956   MRN:  [id number]  PCP:  Taylor Blinks, MD    Chief Complaint: Annual Exam (Pt here for CPE. Pt states that she has not taken FLUoxetine HCI (Cap) over the past 30 days and would like to discuss. )   History of Present Illness:  Taylor Morales is a 60 y.o. very pleasant female patient who presents with the following:  Here today for a new patient visit.  She has been a [facility] pt in the past but is new to my office. History of alcohol abuse, HTN, hyperlipidemia.  She ran out of her prozac and stopped taking it.   She feels that she is generally in good health except for being overweight and drinking too much  Wt Readings from Last 3 Encounters:  11/01/16 233 lb 9.6 oz (106 kg)  04/16/15 232 lb (105.2 kg)  04/03/15 229 lb 12.8 oz (104.2 kg)   She has been taking prozac for about 20 years.  She started on it due to pre-menstrual dysphoria. She has been off it for about 2 months now- she has really not noted any change in her mood or sleep pattern.  She admits that she does drink too much alcohol- she may drink 1/2 a "box" of wine a day. Uncertain volume of box, but notes that until recenty one box would last 3-4 daysr.  She has drunk heavily for years- she is not sure why she is drinking more now. She does feel like she might be drinking more since she came off the prozac but had not really thought about it until now.    She has never had a DUI- she drinks at home only   She ate about 3 hours ago- spinach salad for lunch.  She would like to check cholesterol today as well  She works for [employer]- she has been a Customer service manager for 40 years.  She is married, 2 grown children.    She has noted a tender area in her right calf for a few days- she notes a history of a blood clot 25 years ago due to an  injury that she got skiing.  She has not noted any SOB or hemoptysis.    Also notes that she recently took abx twice for unrelated illness but now seems to have a mild vaginal yeast infection Also notes that her ears may be occluded with wax as they feel clogged and her hearing is reduced.  No ear pain  Patient Active Problem List   Diagnosis Date Noted  . Alcoholism ([place]) 04/17/2015  . Former smoker 04/17/2015  . Obesity 02/27/2010  . FOOT PAIN, RIGHT 05/16/2009  . Hyperlipidemia 09/29/2007  . Depression 09/29/2007  . Essential hypertension 09/29/2007  . Allergic rhinitis 09/29/2007    Past Medical History:  Diagnosis Date  . Allergy    rhinitis  . Depression    stress related in past, on prozac-took self off  . Hyperlipidemia   . Hypertension     Past Surgical History:  Procedure Laterality Date  . BREAST LUMPECTOMY     benign  . CESAREAN SECTION     x2  . NASAL SINUS SURGERY     x2  . TONSILLECTOMY     as child    Social History  Substance Use Topics  . Smoking status: Former Smoker    Packs/day: 1.00    Years: 13.00    Types: Cigarettes    Quit date: 11/29/1982  . Smokeless tobacco: Not on file  . Alcohol use 0.0 oz/week     Comment: bottle of wine a night    Family History  Problem Relation Age of Onset  . Other Father     unknown  . Lymphoma Mother     around 24  . Breast cancer Mother     age 22  . Lymphoma Maternal Uncle   . Pancreatic cancer Mother     ? pancreatic as had whipple    No Known Allergies  Medication list has been reviewed and updated.  Current Outpatient Prescriptions on File Prior to Visit  Medication Sig Dispense Refill  . amLODipine-atorvastatin (CADUET) 5-10 MG tablet Take 1 tablet by mouth daily. 10 tablet 0  . losartan-hydrochlorothiazide (HYZAAR) 100-25 MG tablet TAKE 1 TABLET BY MOUTH DAILY. 90 tablet 3  . valACYclovir (VALTREX) 500 MG tablet Take 1 tablet (500 mg total) by mouth daily as needed. Take 1 once a day  prn 30 tablet 1  . FLUoxetine (PROZAC) 40 MG capsule Take 1 capsule (40 mg total) by mouth daily. (Patient not taking: Reported on 11/01/2016) 10 capsule 0  . [DISCONTINUED] fluticasone (VERAMYST) 27.5 MCG/SPRAY nasal spray Place 1 spray into the nose 2 (two) times daily. 30 g 3   No current facility-administered medications on file prior to visit.     Review of Systems:  As per HPI- otherwise negative. No fever, chills, CP, SOB, rash, nausea, vomiting, diarrhea, weight loss    Physical Examination: Vitals:   11/01/16 1608  BP: (!) 142/82  Pulse: 86  Temp: 98.2 F (36.8 C)   Vitals:   11/01/16 1608  Weight: 233 lb 9.6 oz (106 kg)  Height: 5\' 8"  (1.727 m)   Body mass index is 35.52 kg/m. Ideal Body Weight: Weight in (lb) to have BMI = 25: 164.1  GEN: WDWN, NAD, Non-toxic, A & O x 3, obese, looks well HEENT: Atraumatic, Normocephalic. Neck supple. No masses, No LAD.  Bilateral TM wnl after we lavaged both ears and removed impacted cerumen, oropharynx normal.  PEERL,EOMI.   Ears and Nose: No external deformity. CV: RRR, No M/G/R. No JVD. No thrill. No extra heart sounds. PULM: CTA B, no wheezes, crackles, rhonchi. No retractions. No resp. distress. No accessory muscle use. ABD: S, NT, ND. No rebound. No HSM. EXTR: No c/c/e NEURO Normal gait.  PSYCH: Normally interactive. Conversant. Not depressed or anxious appearing.  Calm demeanor.  She has a tender, slightly firm area in the superficial soft tissue of the right calf just distal to the popliteal fossa.  No redness or heat   Assessment and Plan: Essential hypertension, benign - Plan: Comprehensive metabolic panel  Alcohol abuse - Plan: CBC, Comprehensive metabolic panel, Lipid panel  Screening examination for infectious disease - Plan: Hepatitis C antibody  Screening for diabetes mellitus - Plan: Hemoglobin A1c  Screening for hyperlipidemia - Plan: Lipid panel  Overweight  Moderate episode of recurrent major  depressive disorder ([place]) - Plan: FLUoxetine (PROZAC) 20 MG tablet, FLUoxetine (PROZAC) 40 MG capsule  Antibiotics causing adverse effect in therapeutic use, initial encounter - Plan: fluconazole (DIFLUCAN) 150 MG tablet  Bilateral impacted cerumen  Right calf pain - Plan: US Venous Img Lower Unilateral Right  Here today to establish care and discuss a few concerns Labs pending  as above She has used abx twice recently and now notes some vaginal itching- diflucan rx Upon reflection she notes that she is drinking more since she stopped her prozac- will start back on this at 20 mg and taper up to 40 Will set up an [place] of her right calf to r/o DVT.  Suspect she may have superficial thrombophlebitis/  Recommend warm compresses and daily aspirin Discussed alcohol abuse.  At this time she is not interested in abstinence so will not start on a benzo taper.  Recommended that she change her routine to include non- drinking activities when she gets home in the evening and to cut down over time.  She plans to do so  Will plan further follow- up pending labs.   Signed Taylor Blinks, MD  Received her labs 12/5- letter to pt.   Results for orders placed or performed in visit on 11/01/16  CBC  Result Value Ref Range   WBC 9.6 4.0 - 10.5 K/uL   RBC 4.25 3.87 - 5.11 Mil/uL   Platelets 345.0 150.0 - 400.0 K/uL   Hemoglobin 13.6 12.0 - 15.0 g/dL   HCT 39.1 36.0 - 46.0 %   MCV 92.0 78.0 - 100.0 fl   MCHC 34.7 30.0 - 36.0 g/dL   RDW 12.3 11.5 - 15.5 %  Comprehensive metabolic panel  Result Value Ref Range   Sodium 138 135 - 145 mEq/L   Potassium 3.8 3.5 - 5.1 mEq/L   Chloride 101 96 - 112 mEq/L   CO2 25 19 - 32 mEq/L   Glucose, Bld 108 (H) 70 - 99 mg/dL   BUN 19 6 - 23 mg/dL   Creatinine, Ser 0.97 0.40 - 1.20 mg/dL   Total Bilirubin 0.4 0.2 - 1.2 mg/dL   Alkaline Phosphatase 71 39 - 117 U/L   AST 18 0 - 37 U/L   ALT 19 0 - 35 U/L   Total Protein 7.9 6.0 - 8.3 g/dL   Albumin 4.6 3.5 - 5.2  g/dL   Calcium 9.7 8.4 - 10.5 mg/dL   GFR 62.16 >60.00 mL/min  Lipid panel  Result Value Ref Range   Cholesterol 250 (H) 0 - 200 mg/dL   Triglycerides 224.0 (H) 0.0 - 149.0 mg/dL   HDL 80.70 >39.00 mg/dL   VLDL 44.8 (H) 0.0 - 40.0 mg/dL   Total CHOL/HDL Ratio 3    NonHDL 168.96   Hepatitis C antibody  Result Value Ref Range   HCV Ab NEGATIVE NEGATIVE  Hemoglobin A1c  Result Value Ref Range   Hgb A1c MFr Bld 6.2 4.6 - 6.5 %  LDL cholesterol, direct  Result Value Ref Range   Direct LDL 132.0 mg/dL

## 2016-11-02 ENCOUNTER — Encounter: Payer: Self-pay | Admitting: Family Medicine

## 2016-11-02 DIAGNOSIS — R7303 Prediabetes: Secondary | ICD-10-CM | POA: Insufficient documentation

## 2016-11-02 LAB — COMPREHENSIVE METABOLIC PANEL
ALT: 19 U/L (ref 0–35)
AST: 18 U/L (ref 0–37)
Albumin: 4.6 g/dL (ref 3.5–5.2)
Alkaline Phosphatase: 71 U/L (ref 39–117)
BUN: 19 mg/dL (ref 6–23)
CALCIUM: 9.7 mg/dL (ref 8.4–10.5)
CHLORIDE: 101 meq/L (ref 96–112)
CO2: 25 mEq/L (ref 19–32)
CREATININE: 0.97 mg/dL (ref 0.40–1.20)
GFR: 62.16 mL/min (ref >60.00)
Glucose, Bld: 108 mg/dL — ABNORMAL HIGH (ref 70–99)
POTASSIUM: 3.8 meq/L (ref 3.5–5.1)
Sodium: 138 mEq/L (ref 135–145)
Total Bilirubin: 0.4 mg/dL (ref 0.2–1.2)
Total Protein: 7.9 g/dL (ref 6.0–8.3)

## 2016-11-02 LAB — CBC
HEMATOCRIT: 39.1 % (ref 36.0–46.0)
Hemoglobin: 13.6 g/dL (ref 12.0–15.0)
MCHC: 34.7 g/dL (ref 30.0–36.0)
MCV: 92 fl (ref 78.0–100.0)
PLATELETS: 345 10*3/uL (ref 150.0–400.0)
RBC: 4.25 Mil/uL (ref 3.87–5.11)
RDW: 12.3 % (ref 11.5–15.5)
WBC: 9.6 10*3/uL (ref 4.0–10.5)

## 2016-11-02 LAB — LDL CHOLESTEROL, DIRECT: LDL DIRECT: 132 mg/dL

## 2016-11-02 LAB — LIPID PANEL
CHOL/HDL RATIO: 3
CHOLESTEROL: 250 mg/dL — AB (ref 0–200)
HDL: 80.7 mg/dL (ref >39.00)
NonHDL: 168.96
Triglycerides: 224 mg/dL — ABNORMAL HIGH (ref 0.0–149.0)
VLDL: 44.8 mg/dL — AB (ref 0.0–40.0)

## 2016-11-02 LAB — HEMOGLOBIN A1C: Hgb A1c MFr Bld: 6.2 % (ref 4.6–6.5)

## 2016-11-02 LAB — HEPATITIS C ANTIBODY: HCV Ab: NEGATIVE

## 2016-11-06 ENCOUNTER — Telehealth: Payer: Self-pay | Admitting: Family Medicine

## 2016-11-06 NOTE — Telephone Encounter (Signed)
Called and LMOM for her- please let us know if she still wants to do this [place]

## 2016-11-06 NOTE — Telephone Encounter (Signed)
-----   Message from Katha Hamming sent at 11/04/2016 12:47 PM EST ----- Regarding: US VENOUS We have left two messages for Taylor Morales to call us to schedule her venous ultrasound.   No return phone call.  Thanks, Hoyle Sauer

## 2016-11-28 ENCOUNTER — Other Ambulatory Visit: Payer: Self-pay | Admitting: Family Medicine

## 2016-11-28 DIAGNOSIS — F331 Major depressive disorder, recurrent, moderate: Secondary | ICD-10-CM

## 2016-11-30 ENCOUNTER — Other Ambulatory Visit: Payer: Self-pay | Admitting: Emergency Medicine

## 2017-05-04 ENCOUNTER — Encounter: Payer: Self-pay | Admitting: Family Medicine

## 2017-05-24 NOTE — Progress Notes (Signed)
[place] at [facility] [address] [place], [place] [address] [phone]  Date:  05/25/2017   Name:  Taylor Morales   DOB:  [birth year]   MRN:  [id number]  PCP:  Darreld Mclean, MD    Chief Complaint: Urinary Tract Infection (c/o fatigue, frequent urination, headache, and lower abd pain)   History of Present Illness:  Taylor Morales is a 61 y.o. very pleasant female patient who presents with the following:  Last seen by myself in December History of HTN, pre-diabetes and alcohol abuse.  She was seen on 5/29, 6/11 and 6/25 at a [facility] with concern about bladder infection.  So far we only have the culture results from her 5/29 visit  From her most recent OV with [facility] on 6/25: Taylor Morales is a 61 y.o. female who complains of urinary frequency and urgency for over 1 month. Patient has contacted urology and has schd appt for 3 days from today. Patient also complains of LLQ abdominal pain and vaginal discharge and itch/irritation. Patient has no other complaints. Patient denies back pain, fever, chills, body aches, sore throat, N/V/D. Patient does not have a history of recurrent UTI. Patient does not have a history of pyelonephritis. Denies history of diverticulitis or any other significant GI history or complaints. Patient has been seen here for this problem, this is here 3rd OV. Patient has not tried OTC therapies for her complaints. Patient has been on doxycycline and cefdinir, treat for vaginal yeast with Diflucan, 2 pills, yeast symptoms persist. Patient reports history of prediabetes, states that her labs were normal with PCP 6 months ago. Patient denies sexual activity and she is post menopausal. Denies STD risk.   Urine culture from 5/29 showed GBS This past Monday she was given a diflucan for likely yeast vaginitis but no other treatment  She has a long history of pre-diabetes We will repeat her A1c today as she is due.   She  notes that she continues to have urinary frequency and vague discomfort She did have a urine dip this past Monday but the culture is not back yet - will await this prior to another abx rx  She is a former smoker- quit a long time ago She has been noted to have blood on her dipstick on several occasions over the last year or so She has not seen any gross blood in her urine  She has noted some vaginal itching still although the diflucan does seem to have helped her  She is not SA and is not concerned about STI No fever, chills, back pain, or nausea/ vomiting She does notice pain in her lower left trunk, but on exam this is not in her pelvis, it is in her hip flexors Lab Results  Component Value Date   HGBA1C 6.3 05/25/2017      Patient Active Problem List   Diagnosis Date Noted  . Pre-diabetes 11/02/2016  . Alcoholism ([place]) 04/17/2015  . Former smoker 04/17/2015  . Obesity 02/27/2010  . FOOT PAIN, RIGHT 05/16/2009  . Hyperlipidemia 09/29/2007  . Depression 09/29/2007  . Essential hypertension 09/29/2007  . Allergic rhinitis 09/29/2007    Past Medical History:  Diagnosis Date  . Allergy    rhinitis  . Depression    stress related in past, on prozac-took self off  . Hyperlipidemia   . Hypertension     Past Surgical History:  Procedure Laterality Date  .  BREAST LUMPECTOMY     benign  . CESAREAN SECTION     x2  . NASAL SINUS SURGERY     x2  . TONSILLECTOMY     as child    Social History  Substance Use Topics  . Smoking status: Former Smoker    Packs/day: 1.00    Years: 13.00    Types: Cigarettes    Quit date: 11/29/1982  . Smokeless tobacco: Not on file  . Alcohol use 0.0 oz/week     Comment: bottle of wine a night    Family History  Problem Relation Age of Onset  . Other Father        unknown  . Lymphoma Mother        around 39  . Breast cancer Mother        age 47  . Lymphoma Maternal Uncle   . Pancreatic cancer Mother        ? pancreatic as had  whipple    No Known Allergies  Medication list has been reviewed and updated.  Current Outpatient Prescriptions on File Prior to Visit  Medication Sig Dispense Refill  . FLUoxetine (PROZAC) 40 MG capsule Take 1 capsule (40 mg total) by mouth daily. 90 capsule 3  . losartan-hydrochlorothiazide (HYZAAR) 100-25 MG tablet TAKE 1 TABLET BY MOUTH DAILY. 90 tablet 3  . valACYclovir (VALTREX) 500 MG tablet Take 1 tablet (500 mg total) by mouth daily as needed. Take 1 once a day prn 30 tablet 1  . [DISCONTINUED] fluticasone (VERAMYST) 27.5 MCG/SPRAY nasal spray Place 1 spray into the nose 2 (two) times daily. 30 g 3   No current facility-administered medications on file prior to visit.     Review of Systems:  As per HPI- otherwise negative.   Physical Examination: Vitals:   05/25/17 1252  BP: 132/88  Pulse: 77  Temp: 98.2 F (36.8 C)   Vitals:   05/25/17 1252  Weight: 232 lb (105.2 kg)  Height: 5\' 8"  (1.727 m)   Body mass index is 35.28 kg/m. Ideal Body Weight: Weight in (lb) to have BMI = 25: 164.1  GEN: WDWN, NAD, Non-toxic, A & O x 3, obese, otherwise looks well HEENT: Atraumatic, Normocephalic. Neck supple. No masses, No LAD. Ears and Nose: No external deformity. CV: RRR, No M/G/R. No JVD. No thrill. No extra heart sounds. PULM: CTA B, no wheezes, crackles, rhonchi. No retractions. No resp. distress. No accessory muscle use. ABD: S, NT, ND, +BS. No rebound. No HSM. She notes mild tenderness over the left anterior hip/ hip flexor muscles  No tenderness in the abd or pelvis EXTR: No c/c/e NEURO Normal gait.  PSYCH: Normally interactive. Conversant. Not depressed or anxious appearing.  Calm demeanor.  Pelvic: normal post menopause exam with some atrophy of vaginal vault, no vaginal lesions or discharge. Uterus normal, no CMT, no adnexal tendereness or masses  Assessment and Plan: Pre-diabetes - Plan: Hemoglobin A1c  Vaginal itching - Plan: Cervicovaginal ancillary  only  Urinary frequency - Plan: CBC, Comprehensive metabolic panel  Microhematuria  Follow-up from several recent visits to a [facility] practice with possible UTI today She has been noted to have microhematuria on dipstick several times.  No micro done that I can see, but given number of positive dipsticks and smoking history I agree with urology eval. She will reschedule this visit and let me know if any problems getting in to be seen Otherwise she will let me know when her most recent urine culture  comes in and I will be in touch with her labs   Results for orders placed or performed in visit on 05/25/17  Hemoglobin A1c  Result Value Ref Range   Hgb A1c MFr Bld 6.3 4.6 - 6.5 %  CBC  Result Value Ref Range   WBC 8.2 4.0 - 10.5 K/uL   RBC 4.10 3.87 - 5.11 Mil/uL   Platelets 342.0 150.0 - 400.0 K/uL   Hemoglobin 12.9 12.0 - 15.0 g/dL   HCT 38.3 36.0 - 46.0 %   MCV 93.4 78.0 - 100.0 fl   MCHC 33.8 30.0 - 36.0 g/dL   RDW 12.3 11.5 - 15.5 %  Comprehensive metabolic panel  Result Value Ref Range   Sodium 136 135 - 145 mEq/L   Potassium 3.9 3.5 - 5.1 mEq/L   Chloride 102 96 - 112 mEq/L   CO2 27 19 - 32 mEq/L   Glucose, Bld 102 (H) 70 - 99 mg/dL   BUN 16 6 - 23 mg/dL   Creatinine, Ser 0.87 0.40 - 1.20 mg/dL   Total Bilirubin 0.4 0.2 - 1.2 mg/dL   Alkaline Phosphatase 62 39 - 117 U/L   AST 21 0 - 37 U/L   ALT 23 0 - 35 U/L   Total Protein 7.1 6.0 - 8.3 g/dL   Albumin 4.4 3.5 - 5.2 g/dL   Calcium 9.7 8.4 - 10.5 mg/dL   GFR 70.35 >60.00 mL/min     Signed Lamar Blinks, MD

## 2017-05-25 ENCOUNTER — Other Ambulatory Visit (HOSPITAL_COMMUNITY)
Admission: RE | Admit: 2017-05-25 | Discharge: 2017-05-25 | Disposition: A | Discharge status: Home / Self Care | Payer: BLUE CROSS/BLUE SHIELD | Source: Ambulatory Visit | Attending: Family Medicine | Admitting: Family Medicine

## 2017-05-25 ENCOUNTER — Ambulatory Visit (INDEPENDENT_AMBULATORY_CARE_PROVIDER_SITE_OTHER): Payer: BLUE CROSS/BLUE SHIELD | Admitting: Family Medicine

## 2017-05-25 ENCOUNTER — Encounter: Payer: Self-pay | Admitting: Family Medicine

## 2017-05-25 VITALS — BP 132/88 | HR 77 | Temp 98.2°F | Ht 68.0 in | Wt 232.0 lb

## 2017-05-25 DIAGNOSIS — R350 Frequency of micturition: Secondary | ICD-10-CM | POA: Diagnosis not present

## 2017-05-25 DIAGNOSIS — L298 Other pruritus: Secondary | ICD-10-CM

## 2017-05-25 DIAGNOSIS — N898 Other specified noninflammatory disorders of vagina: Secondary | ICD-10-CM

## 2017-05-25 DIAGNOSIS — R7303 Prediabetes: Secondary | ICD-10-CM | POA: Diagnosis not present

## 2017-05-25 DIAGNOSIS — R3129 Other microscopic hematuria: Secondary | ICD-10-CM

## 2017-05-25 LAB — COMPREHENSIVE METABOLIC PANEL
ALBUMIN: 4.4 g/dL (ref 3.5–5.2)
ALK PHOS: 62 U/L (ref 39–117)
ALT: 23 U/L (ref 0–35)
AST: 21 U/L (ref 0–37)
BUN: 16 mg/dL (ref 6–23)
CO2: 27 mEq/L (ref 19–32)
CREATININE: 0.87 mg/dL (ref 0.40–1.20)
Calcium: 9.7 mg/dL (ref 8.4–10.5)
Chloride: 102 mEq/L (ref 96–112)
GFR: 70.35 mL/min (ref >60.00)
Glucose, Bld: 102 mg/dL — ABNORMAL HIGH (ref 70–99)
Potassium: 3.9 mEq/L (ref 3.5–5.1)
SODIUM: 136 meq/L (ref 135–145)
TOTAL PROTEIN: 7.1 g/dL (ref 6.0–8.3)
Total Bilirubin: 0.4 mg/dL (ref 0.2–1.2)

## 2017-05-25 LAB — CBC
HCT: 38.3 % (ref 36.0–46.0)
Hemoglobin: 12.9 g/dL (ref 12.0–15.0)
MCHC: 33.8 g/dL (ref 30.0–36.0)
MCV: 93.4 fl (ref 78.0–100.0)
Platelets: 342 10*3/uL (ref 150.0–400.0)
RBC: 4.1 Mil/uL (ref 3.87–5.11)
RDW: 12.3 % (ref 11.5–15.5)
WBC: 8.2 10*3/uL (ref 4.0–10.5)

## 2017-05-25 LAB — HEMOGLOBIN A1C: HEMOGLOBIN A1C: 6.3 % (ref 4.6–6.5)

## 2017-05-25 NOTE — Patient Instructions (Addendum)
Please do go ahead and see urology- we need to evaluate the small amount of blood in your urine  We will check your A1c and also your blood count and kidney function today I will be in touch with your results asap Take care and let me know if you have any difficulty getting in with urology

## 2017-05-27 LAB — CERVICOVAGINAL ANCILLARY ONLY
Bacterial vaginitis: NEGATIVE
Candida vaginitis: NEGATIVE

## 2017-08-17 DIAGNOSIS — Z720 Tobacco use: Secondary | ICD-10-CM | POA: Diagnosis not present

## 2017-08-17 DIAGNOSIS — R3129 Other microscopic hematuria: Secondary | ICD-10-CM | POA: Diagnosis not present

## 2017-08-17 DIAGNOSIS — Z87440 Personal history of urinary (tract) infections: Secondary | ICD-10-CM | POA: Diagnosis not present

## 2017-09-05 DIAGNOSIS — R319 Hematuria, unspecified: Secondary | ICD-10-CM | POA: Diagnosis not present

## 2017-09-28 DIAGNOSIS — Z720 Tobacco use: Secondary | ICD-10-CM | POA: Diagnosis not present

## 2017-09-28 DIAGNOSIS — Z87440 Personal history of urinary (tract) infections: Secondary | ICD-10-CM | POA: Diagnosis not present

## 2017-09-28 DIAGNOSIS — R3129 Other microscopic hematuria: Secondary | ICD-10-CM | POA: Diagnosis not present

## 2017-11-08 ENCOUNTER — Telehealth: Payer: Self-pay | Admitting: Family Medicine

## 2017-11-08 NOTE — Telephone Encounter (Signed)
Copied from [place] [id number]. Topic: Quick Communication - See Telephone Encounter >> Nov 08, 2017 10:19 AM Bea Graff, NT wrote: CRM for notification. See Telephone encounter for: Pt needs Valtrex refilled. [facility] [address] [facility] [place]  11/08/17.

## 2017-11-08 NOTE — Telephone Encounter (Signed)
Left message to call back-patient is going to need a new Rx with new instructions

## 2017-11-17 ENCOUNTER — Telehealth: Payer: Self-pay | Admitting: Family Medicine

## 2017-11-17 ENCOUNTER — Other Ambulatory Visit: Payer: Self-pay

## 2017-11-17 MED ORDER — VALACYCLOVIR HCL 500 MG PO TABS: 500.0000 mg | ORAL_TABLET | Freq: Every day | ORAL | 1 refills | Status: DC | PRN

## 2017-11-17 NOTE — Telephone Encounter (Signed)
Patient called on 11/08/17 for a refill on Valtrex.  See previous encounter / valACYclovir (VALTREX) 500 MG tablet 30 tablet 1 04/22/2015    Sig - Route: Take 1 tablet (500 mg total) by mouth daily as needed. Take 1 once a day prn - Oral   E-Prescribing Status: Receipt confirmed by pharmacy (04/22/2015 8:07 AM EDT)    This prescription has not been refilled since 2016 / I will forward to provider to see if it can be authorized.  Patient is going out of town and needs this medication today. / [facility] [address] [facility]. /

## 2017-11-17 NOTE — Telephone Encounter (Signed)
Advised patient rx was sent in earlier today

## 2017-12-16 ENCOUNTER — Other Ambulatory Visit: Payer: Self-pay | Admitting: Emergency Medicine

## 2017-12-16 MED ORDER — VALACYCLOVIR HCL 500 MG PO TABS: 500.0000 mg | ORAL_TABLET | Freq: Every day | ORAL | 1 refills | Status: DC | PRN

## 2018-01-11 ENCOUNTER — Other Ambulatory Visit: Payer: Self-pay | Admitting: Family Medicine

## 2018-01-11 MED ORDER — LOSARTAN POTASSIUM-HCTZ 100-25 MG PO TABS: 1.0000 | ORAL_TABLET | Freq: Every day | ORAL | 3 refills | Status: DC

## 2018-01-11 NOTE — Telephone Encounter (Signed)
Rx sent 

## 2018-01-11 NOTE — Telephone Encounter (Signed)
Pt calling for refill on Hyzaar. According to the chart it has expired on 09/17/17. Please review. LOV was 05/25/17.

## 2018-01-11 NOTE — Telephone Encounter (Signed)
Copied from [place]. Topic: Quick Communication - Rx Refill/Question >> Jan 11, 2018  3:20 PM Cecelia Byars, NT wrote: Medication:  losartan-hydrochlorothiazide (HYZAAR) 100-25 MG tablet  Has the patient contacted their pharmacy? {yes  (Agent: If no, request that the patient contact the pharmacy for the refill.) Preferred Pharmacy (with phone number or street name): [id number] [place], [place] - [address] [phone] (Phone) [phone] (Fax)   Agent: Please be advised that RX refills may take up to 3 business days. We ask that you follow-up with your pharmacy.

## 2018-01-12 ENCOUNTER — Other Ambulatory Visit: Payer: Self-pay | Admitting: Family Medicine

## 2018-01-12 DIAGNOSIS — F331 Major depressive disorder, recurrent, moderate: Secondary | ICD-10-CM

## 2018-01-13 NOTE — Telephone Encounter (Signed)
Received refill request for FLUoxetine (PROZAC) 40 MG capsule. Last office visit 05/25/17 and last refill 11/01/16. Due to date of last office visit and refill only filled for 30 days with no refills.Office visit required for additional refills. Refill sent to pharmacy.

## 2018-02-10 ENCOUNTER — Other Ambulatory Visit: Payer: Self-pay | Admitting: Family Medicine

## 2018-02-10 DIAGNOSIS — F331 Major depressive disorder, recurrent, moderate: Secondary | ICD-10-CM

## 2018-02-23 ENCOUNTER — Other Ambulatory Visit: Payer: Self-pay | Admitting: Emergency Medicine

## 2018-02-23 DIAGNOSIS — F331 Major depressive disorder, recurrent, moderate: Secondary | ICD-10-CM

## 2018-02-23 MED ORDER — FLUOXETINE HCL 40 MG PO CAPS
ORAL_CAPSULE | ORAL | 1 refills | Status: DC
Start: 2018-02-23 — End: 2018-12-08

## 2018-02-23 NOTE — Telephone Encounter (Signed)
Received refill request for 90 supply of fluoxetine HCL 40 mg CAPS. [facility].

## 2018-04-15 DIAGNOSIS — J329 Chronic sinusitis, unspecified: Secondary | ICD-10-CM | POA: Diagnosis not present

## 2018-04-15 DIAGNOSIS — J40 Bronchitis, not specified as acute or chronic: Secondary | ICD-10-CM | POA: Diagnosis not present

## 2018-08-22 DIAGNOSIS — Z803 Family history of malignant neoplasm of breast: Secondary | ICD-10-CM | POA: Diagnosis not present

## 2018-08-22 DIAGNOSIS — Z1231 Encounter for screening mammogram for malignant neoplasm of breast: Secondary | ICD-10-CM | POA: Diagnosis not present

## 2018-08-29 NOTE — Progress Notes (Addendum)
[facility] [place], [place], [place] [address] [phone]  Date:  08/31/2018   Name:  Taylor Morales   DOB:  1956-01-09   MRN:  [id number]  PCP:  Darreld Mclean, MD    Chief Complaint: Annual Exam (high blood pressure, refill on flonase) and Flu Vaccine (flu shot and tetanus)   History of Present Illness:  Taylor Morales is a 62 y.o. very pleasant female patient who presents with the following:  Here today for a CPE History of pre-diabetes, alcohol abuse, obesity, hyperlipidemia, HTN  I last saw he about 15 months ago- at that time she was going to see urology for microhematuria Thankfully all looked ok, no sign of bladder or renal cancer  Pap: due, will do today.  Never had an abnl. No PMB Mammo: 9/19 Colon: 2013, ok- she thinks that this is due actually and will set this up  Labs: due She is not fasting- she ate a sandwich.  Wants to do labs today anyway which is fine  Immun:   Needs flu, tetanus- tdap  She has been under a lot of stress at work recently- the last few days she has come home with a headache  She thought that this might mean that her BP was high, but it looks ok today She is still taking her hyzaar   prozac 40 hyzaar  She hopes to retire in the next year or so   BP Readings from Last 3 Encounters:  08/31/18 130/72  05/25/17 132/88  11/01/16 (!) 142/82     Patient Active Problem List   Diagnosis Date Noted  . Pre-diabetes 11/02/2016  . Alcoholism ([place]) 04/17/2015  . Former smoker 04/17/2015  . Obesity 02/27/2010  . FOOT PAIN, RIGHT 05/16/2009  . Hyperlipidemia 09/29/2007  . Depression 09/29/2007  . Essential hypertension 09/29/2007  . Allergic rhinitis 09/29/2007    Past Medical History:  Diagnosis Date  . Allergy    rhinitis  . Depression    stress related in past, on prozac-took self off  . Hyperlipidemia   . Hypertension     Past Surgical History:  Procedure  Laterality Date  . BREAST LUMPECTOMY     benign  . CESAREAN SECTION     x2  . NASAL SINUS SURGERY     x2  . TONSILLECTOMY     as child    Social History   Tobacco Use  . Smoking status: Former Smoker    Packs/day: 1.00    Years: 13.00    Pack years: 13.00    Types: Cigarettes    Last attempt to quit: 11/29/1982    Years since quitting: 35.7  Substance Use Topics  . Alcohol use: Yes    Alcohol/week: 0.0 standard drinks    Comment: bottle of wine a night  . Drug use: No    Family History  Problem Relation Age of Onset  . Other Father        unknown  . Lymphoma Mother        around 82  . Breast cancer Mother        age 41  . Lymphoma Maternal Uncle   . Pancreatic cancer Mother        ? pancreatic as had whipple    No Known Allergies  Medication list has been reviewed and updated.  Current Outpatient Medications on File Prior to Visit  Medication Sig Dispense Refill  .  FLUoxetine (PROZAC) 40 MG capsule TAKE 1 CAPSULE BY MOUTH EVERY DAY 90 capsule 1  . losartan-hydrochlorothiazide (HYZAAR) 100-25 MG tablet Take 1 tablet by mouth daily. 90 tablet 3  . valACYclovir (VALTREX) 500 MG tablet Take 1 tablet (500 mg total) by mouth daily as needed. Take 1 once a day prn 30 tablet 1  . [DISCONTINUED] fluticasone (VERAMYST) 27.5 MCG/SPRAY nasal spray Place 1 spray into the nose 2 (two) times daily. 30 g 3   No current facility-administered medications on file prior to visit.     Review of Systems:  As per HPI- otherwise negative. No CP or SOB No rash   Physical Examination: Vitals:   08/31/18 1428  BP: 130/72  Pulse: 91  Resp: 16  Temp: 98.1 F (36.7 C)  SpO2: 97%   Vitals:   08/31/18 1428  Weight: 232 lb (105.2 kg)  Height: 5\' 8"  (1.727 m)   Body mass index is 35.28 kg/m. Ideal Body Weight: Weight in (lb) to have BMI = 25: 164.1  GEN: WDWN, NAD, Non-toxic, A & O x 3, overweight, looks well  HEENT: Atraumatic, Normocephalic. Neck supple. No masses, No  LAD.  Bilateral TM wnl, oropharynx normal.  PEERL,EOMI.   Ears and Nose: No external deformity. CV: RRR, No M/G/R. No JVD. No thrill. No extra heart sounds. PULM: CTA B, no wheezes, crackles, rhonchi. No retractions. No resp. distress. No accessory muscle use. ABD: S, NT, ND. No rebound. No HSM.   EXTR: No c/c/e NEURO Normal gait.  PSYCH: Normally interactive. Conversant. Not depressed or anxious appearing.  Calm demeanor.  Pelvic: normal, no vaginal lesions or discharge. Uterus normal, no CMT, no adnexal tendereness or masses  Assessment and Plan: Physical exam  Pre-diabetes - Plan: Comprehensive metabolic panel, Hemoglobin A1c  Essential hypertension, benign - Plan: CBC  Screening for hyperlipidemia - Plan: Lipid panel  Alcoholism ([place])  Immunization due - Plan: Flu Vaccine QUAD 36+ mos IM, Tdap vaccine greater than or equal to 7yo IM  Screening for breast cancer  Screening for cervical cancer - Plan: Cytology - PAP  Allergic rhinitis, unspecified seasonality, unspecified trigger - Plan: fluticasone (FLONASE) 50 MCG/ACT nasal spray  CPE today Labs pending as above Gave flu and tdap today BP is under control, continue current med Pap pending   It was good to see you today- take care and I will be in touch with your labs and pap asap  It seems that your headache is stress related- I hope that it gets better.   We can consider going up on your prozac if need be- please let me know   Offered to go up on prozac but she declines for now- will see how things go at her job     Signed Lamar Blinks, MD  Remind about shingrix with labs   Received her pap and labs 10/8  Results for orders placed or performed in visit on 08/31/18  CBC  Result Value Ref Range   WBC 8.6 4.0 - 10.5 K/uL   RBC 4.30 3.87 - 5.11 Mil/uL   Platelets 333.0 150.0 - 400.0 K/uL   Hemoglobin 13.7 12.0 - 15.0 g/dL   HCT 39.7 36.0 - 46.0 %   MCV 92.2 78.0 - 100.0 fl   MCHC 34.5 30.0 - 36.0 g/dL    RDW 12.5 11.5 - 15.5 %  Comprehensive metabolic panel  Result Value Ref Range   Sodium 136 135 - 145 mEq/L   Potassium 4.1 3.5 - 5.1 mEq/L  Chloride 100 96 - 112 mEq/L   CO2 30 19 - 32 mEq/L   Glucose, Bld 93 70 - 99 mg/dL   BUN 15 6 - 23 mg/dL   Creatinine, Ser 0.84 0.40 - 1.20 mg/dL   Total Bilirubin 0.4 0.2 - 1.2 mg/dL   Alkaline Phosphatase 83 39 - 117 U/L   AST 18 0 - 37 U/L   ALT 20 0 - 35 U/L   Total Protein 7.2 6.0 - 8.3 g/dL   Albumin 4.5 3.5 - 5.2 g/dL   Calcium 9.8 8.4 - 10.5 mg/dL   GFR 72.95 >60.00 mL/min  Hemoglobin A1c  Result Value Ref Range   Hgb A1c MFr Bld 6.3 4.6 - 6.5 %  Lipid panel  Result Value Ref Range   Cholesterol 217 (H) 0 - 200 mg/dL   Triglycerides 140.0 0.0 - 149.0 mg/dL   HDL 66.60 >39.00 mg/dL   VLDL 28.0 0.0 - 40.0 mg/dL   LDL Cholesterol 123 (H) 0 - 99 mg/dL   Total CHOL/HDL Ratio 3    NonHDL 150.61   Cytology - PAP  Result Value Ref Range   Adequacy      Satisfactory for evaluation  endocervical/transformation zone component PRESENT.   Diagnosis      NEGATIVE FOR INTRAEPITHELIAL LESIONS OR MALIGNANCY.   HPV NOT DETECTED    Material Submitted CervicoVaginal Pap [ThinPrep Imaged]    CYTOLOGY - PAP PAP RESULT    Letter to pt   Blood counts are normal Metabolic profile is normal A1c is in the pre-diabetes range, continue to monitor Your cholesterol is not bad overall; we could consider a low dose of a cholesterol med to reduce your LDL if you are interested.  Let me know your thoughts here Pap is normal, negative for HPV virus. Let's please visit in 6 months and take care

## 2018-08-31 ENCOUNTER — Encounter: Payer: Self-pay | Admitting: Family Medicine

## 2018-08-31 ENCOUNTER — Ambulatory Visit (INDEPENDENT_AMBULATORY_CARE_PROVIDER_SITE_OTHER): Payer: BLUE CROSS/BLUE SHIELD | Admitting: Family Medicine

## 2018-08-31 ENCOUNTER — Other Ambulatory Visit (HOSPITAL_COMMUNITY)
Admission: RE | Admit: 2018-08-31 | Discharge: 2018-08-31 | Disposition: A | Discharge status: Home / Self Care | Payer: BLUE CROSS/BLUE SHIELD | Source: Ambulatory Visit | Attending: Family Medicine | Admitting: Family Medicine

## 2018-08-31 VITALS — BP 130/72 | HR 91 | Temp 98.1°F | Resp 16 | Ht 68.0 in | Wt 232.0 lb

## 2018-08-31 DIAGNOSIS — I10 Essential (primary) hypertension: Secondary | ICD-10-CM | POA: Diagnosis not present

## 2018-08-31 DIAGNOSIS — R7303 Prediabetes: Secondary | ICD-10-CM

## 2018-08-31 DIAGNOSIS — Z23 Encounter for immunization: Secondary | ICD-10-CM

## 2018-08-31 DIAGNOSIS — Z13220 Encounter for screening for lipoid disorders: Secondary | ICD-10-CM | POA: Diagnosis not present

## 2018-08-31 DIAGNOSIS — F1020 Alcohol dependence, uncomplicated: Secondary | ICD-10-CM

## 2018-08-31 DIAGNOSIS — Z1239 Encounter for other screening for malignant neoplasm of breast: Secondary | ICD-10-CM

## 2018-08-31 DIAGNOSIS — Z124 Encounter for screening for malignant neoplasm of cervix: Secondary | ICD-10-CM

## 2018-08-31 DIAGNOSIS — Z0000 Encounter for general adult medical examination without abnormal findings: Secondary | ICD-10-CM

## 2018-08-31 DIAGNOSIS — J309 Allergic rhinitis, unspecified: Secondary | ICD-10-CM

## 2018-08-31 LAB — LIPID PANEL
CHOL/HDL RATIO: 3
CHOLESTEROL: 217 mg/dL — AB (ref 0–200)
HDL: 66.6 mg/dL (ref >39.00)
LDL CALC: 123 mg/dL — AB (ref 0–99)
NONHDL: 150.61
Triglycerides: 140 mg/dL (ref 0.0–149.0)
VLDL: 28 mg/dL (ref 0.0–40.0)

## 2018-08-31 LAB — CBC
HCT: 39.7 % (ref 36.0–46.0)
HEMOGLOBIN: 13.7 g/dL (ref 12.0–15.0)
MCHC: 34.5 g/dL (ref 30.0–36.0)
MCV: 92.2 fl (ref 78.0–100.0)
PLATELETS: 333 10*3/uL (ref 150.0–400.0)
RBC: 4.3 Mil/uL (ref 3.87–5.11)
RDW: 12.5 % (ref 11.5–15.5)
WBC: 8.6 10*3/uL (ref 4.0–10.5)

## 2018-08-31 LAB — COMPREHENSIVE METABOLIC PANEL
ALBUMIN: 4.5 g/dL (ref 3.5–5.2)
ALT: 20 U/L (ref 0–35)
AST: 18 U/L (ref 0–37)
Alkaline Phosphatase: 83 U/L (ref 39–117)
BUN: 15 mg/dL (ref 6–23)
CALCIUM: 9.8 mg/dL (ref 8.4–10.5)
CHLORIDE: 100 meq/L (ref 96–112)
CO2: 30 mEq/L (ref 19–32)
CREATININE: 0.84 mg/dL (ref 0.40–1.20)
GFR: 72.95 mL/min (ref >60.00)
Glucose, Bld: 93 mg/dL (ref 70–99)
Potassium: 4.1 mEq/L (ref 3.5–5.1)
Sodium: 136 mEq/L (ref 135–145)
Total Bilirubin: 0.4 mg/dL (ref 0.2–1.2)
Total Protein: 7.2 g/dL (ref 6.0–8.3)

## 2018-08-31 LAB — HEMOGLOBIN A1C: Hgb A1c MFr Bld: 6.3 % (ref 4.6–6.5)

## 2018-08-31 MED ORDER — FLUTICASONE PROPIONATE 50 MCG/ACT NA SUSP: 2.0000 | Freq: Every day | NASAL | 12 refills | Status: DC

## 2018-08-31 NOTE — Patient Instructions (Addendum)
It was good to see you today- take care and I will be in touch with your labs and pap asap  It seems that your headache is stress related- I hope that it gets better.   We can consider going up on your prozac if need be- please let me know   Health Maintenance, Female Adopting a healthy lifestyle and getting preventive care can go a long way to promote health and wellness. Talk with your health care provider about what schedule of regular examinations is right for you. This is a good chance for you to check in with your provider about disease prevention and staying healthy. In between checkups, there are plenty of things you can do on your own. Experts have done a lot of research about which lifestyle changes and preventive measures are most likely to keep you healthy. Ask your health care provider for more information. Weight and diet Eat a healthy diet  Be sure to include plenty of vegetables, fruits, low-fat dairy products, and lean protein.  Do not eat a lot of foods high in solid fats, added sugars, or salt.  Get regular exercise. This is one of the most important things you can do for your health. ? Most adults should exercise for at least 150 minutes each week. The exercise should increase your heart rate and make you sweat (moderate-intensity exercise). ? Most adults should also do strengthening exercises at least twice a week. This is in addition to the moderate-intensity exercise.  Maintain a healthy weight  Body mass index (BMI) is a measurement that can be used to identify possible weight problems. It estimates body fat based on height and weight. Your health care provider can help determine your BMI and help you achieve or maintain a healthy weight.  For females 56 years of age and older: ? A BMI below 18.5 is considered underweight. ? A BMI of 18.5 to 24.9 is normal. ? A BMI of 25 to 29.9 is considered overweight. ? A BMI of 30 and above is considered obese.  Watch levels of  cholesterol and blood lipids  You should start having your blood tested for lipids and cholesterol at 62 years of age, then have this test every 5 years.  You may need to have your cholesterol levels checked more often if: ? Your lipid or cholesterol levels are high. ? You are older than 62 years of age. ? You are at high risk for heart disease.  Cancer screening Lung Cancer  Lung cancer screening is recommended for adults 85-61 years old who are at high risk for lung cancer because of a history of smoking.  A yearly low-dose CT scan of the lungs is recommended for people who: ? Currently smoke. ? Have quit within the past 15 years. ? Have at least a 30-pack-year history of smoking. A pack year is smoking an average of one pack of cigarettes a day for 1 year.  Yearly screening should continue until it has been 15 years since you quit.  Yearly screening should stop if you develop a health problem that would prevent you from having lung cancer treatment.  Breast Cancer  Practice breast self-awareness. This means understanding how your breasts normally appear and feel.  It also means doing regular breast self-exams. Let your health care provider know about any changes, no matter how small.  If you are in your 20s or 30s, you should have a clinical breast exam (CBE) by a health care provider every 1-3  years as part of a regular health exam.  If you are 64 or older, have a CBE every year. Also consider having a breast X-ray (mammogram) every year.  If you have a family history of breast cancer, talk to your health care provider about genetic screening.  If you are at high risk for breast cancer, talk to your health care provider about having an MRI and a mammogram every year.  Breast cancer gene (BRCA) assessment is recommended for women who have family members with BRCA-related cancers. BRCA-related cancers include: ? Breast. ? Ovarian. ? Tubal. ? Peritoneal cancers.  Results  of the assessment will determine the need for genetic counseling and BRCA1 and BRCA2 testing.  Cervical Cancer Your health care provider may recommend that you be screened regularly for cancer of the pelvic organs (ovaries, uterus, and vagina). This screening involves a pelvic examination, including checking for microscopic changes to the surface of your cervix (Pap test). You may be encouraged to have this screening done every 3 years, beginning at age 27.  For women ages 79-65, health care providers may recommend pelvic exams and Pap testing every 3 years, or they may recommend the Pap and pelvic exam, combined with testing for human papilloma virus (HPV), every 5 years. Some types of HPV increase your risk of cervical cancer. Testing for HPV may also be done on women of any age with unclear Pap test results.  Other health care providers may not recommend any screening for nonpregnant women who are considered low risk for pelvic cancer and who do not have symptoms. Ask your health care provider if a screening pelvic exam is right for you.  If you have had past treatment for cervical cancer or a condition that could lead to cancer, you need Pap tests and screening for cancer for at least 20 years after your treatment. If Pap tests have been discontinued, your risk factors (such as having a new sexual partner) need to be reassessed to determine if screening should resume. Some women have medical problems that increase the chance of getting cervical cancer. In these cases, your health care provider may recommend more frequent screening and Pap tests.  Colorectal Cancer  This type of cancer can be detected and often prevented.  Routine colorectal cancer screening usually begins at 62 years of age and continues through 62 years of age.  Your health care provider may recommend screening at an earlier age if you have risk factors for colon cancer.  Your health care provider may also recommend using  home test kits to check for hidden blood in the stool.  A small camera at the end of a tube can be used to examine your colon directly (sigmoidoscopy or colonoscopy). This is done to check for the earliest forms of colorectal cancer.  Routine screening usually begins at age 14.  Direct examination of the colon should be repeated every 5-10 years through 62 years of age. However, you may need to be screened more often if early forms of precancerous polyps or small growths are found.  Skin Cancer  Check your skin from head to toe regularly.  Tell your health care provider about any new moles or changes in moles, especially if there is a change in a mole's shape or color.  Also tell your health care provider if you have a mole that is larger than the size of a pencil eraser.  Always use sunscreen. Apply sunscreen liberally and repeatedly throughout the day.  Protect yourself  by wearing long sleeves, pants, a wide-brimmed hat, and sunglasses whenever you are outside.  Heart disease, diabetes, and high blood pressure  High blood pressure causes heart disease and increases the risk of stroke. High blood pressure is more likely to develop in: ? People who have blood pressure in the high end of the normal range (130-139/85-89 mm Hg). ? People who are overweight or obese. ? People who are African American.  If you are 11-31 years of age, have your blood pressure checked every 3-5 years. If you are 15 years of age or older, have your blood pressure checked every year. You should have your blood pressure measured twice-once when you are at a hospital or clinic, and once when you are not at a hospital or clinic. Record the average of the two measurements. To check your blood pressure when you are not at a hospital or clinic, you can use: ? An automated blood pressure machine at a pharmacy. ? A home blood pressure monitor.  If you are between 27 years and 48 years old, ask your health care provider  if you should take aspirin to prevent strokes.  Have regular diabetes screenings. This involves taking a blood sample to check your fasting blood sugar level. ? If you are at a normal weight and have a low risk for diabetes, have this test once every three years after 62 years of age. ? If you are overweight and have a high risk for diabetes, consider being tested at a younger age or more often. Preventing infection Hepatitis B  If you have a higher risk for hepatitis B, you should be screened for this virus. You are considered at high risk for hepatitis B if: ? You were born in a country where hepatitis B is common. Ask your health care provider which countries are considered high risk. ? Your parents were born in a high-risk country, and you have not been immunized against hepatitis B (hepatitis B vaccine). ? You have HIV or AIDS. ? You use needles to inject street drugs. ? You live with someone who has hepatitis B. ? You have had sex with someone who has hepatitis B. ? You get hemodialysis treatment. ? You take certain medicines for conditions, including cancer, organ transplantation, and autoimmune conditions.  Hepatitis C  Blood testing is recommended for: ? Everyone born from [birth year] through 1965. ? Anyone with known risk factors for hepatitis C.  Sexually transmitted infections (STIs)  You should be screened for sexually transmitted infections (STIs) including gonorrhea and chlamydia if: ? You are sexually active and are younger than 62 years of age. ? You are older than 62 years of age and your health care provider tells you that you are at risk for this type of infection. ? Your sexual activity has changed since you were last screened and you are at an increased risk for chlamydia or gonorrhea. Ask your health care provider if you are at risk.  If you do not have HIV, but are at risk, it may be recommended that you take a prescription medicine daily to prevent HIV infection. This  is called pre-exposure prophylaxis (PrEP). You are considered at risk if: ? You are sexually active and do not regularly use condoms or know the HIV status of your partner(s). ? You take drugs by injection. ? You are sexually active with a partner who has HIV.  Talk with your health care provider about whether you are at high risk of being infected  with HIV. If you choose to begin PrEP, you should first be tested for HIV. You should then be tested every 3 months for as long as you are taking PrEP. Pregnancy  If you are premenopausal and you may become pregnant, ask your health care provider about preconception counseling.  If you may become pregnant, take 400 to 800 micrograms (mcg) of folic acid every day.  If you want to prevent pregnancy, talk to your health care provider about birth control (contraception). Osteoporosis and menopause  Osteoporosis is a disease in which the bones lose minerals and strength with aging. This can result in serious bone fractures. Your risk for osteoporosis can be identified using a bone density scan.  If you are 49 years of age or older, or if you are at risk for osteoporosis and fractures, ask your health care provider if you should be screened.  Ask your health care provider whether you should take a calcium or vitamin D supplement to lower your risk for osteoporosis.  Menopause may have certain physical symptoms and risks.  Hormone replacement therapy may reduce some of these symptoms and risks. Talk to your health care provider about whether hormone replacement therapy is right for you. Follow these instructions at home:  Schedule regular health, dental, and eye exams.  Stay current with your immunizations.  Do not use any tobacco products including cigarettes, chewing tobacco, or electronic cigarettes.  If you are pregnant, do not drink alcohol.  If you are breastfeeding, limit how much and how often you drink alcohol.  Limit alcohol intake  to no more than 1 drink per day for nonpregnant women. One drink equals 12 ounces of beer, 5 ounces of wine, or 1 ounces of hard liquor.  Do not use street drugs.  Do not share needles.  Ask your health care provider for help if you need support or information about quitting drugs.  Tell your health care provider if you often feel depressed.  Tell your health care provider if you have ever been abused or do not feel safe at home. This information is not intended to replace advice given to you by your health care provider. Make sure you discuss any questions you have with your health care provider. Document Released: 05/31/2011 Document Revised: 04/22/2016 Document Reviewed: 08/19/2015 Elsevier Interactive Patient Education  [employer].

## 2018-09-04 LAB — CYTOLOGY - PAP
DIAGNOSIS: NEGATIVE
HPV (WINDOPATH): NOT DETECTED

## 2018-12-08 ENCOUNTER — Other Ambulatory Visit: Payer: Self-pay | Admitting: Family Medicine

## 2018-12-08 DIAGNOSIS — F331 Major depressive disorder, recurrent, moderate: Secondary | ICD-10-CM

## 2019-01-30 DIAGNOSIS — D123 Benign neoplasm of transverse colon: Secondary | ICD-10-CM | POA: Diagnosis not present

## 2019-01-30 DIAGNOSIS — Z86010 Personal history of colonic polyps: Secondary | ICD-10-CM | POA: Diagnosis not present

## 2019-01-30 LAB — HM COLONOSCOPY

## 2019-02-02 ENCOUNTER — Telehealth: Payer: Self-pay | Admitting: *Deleted

## 2019-02-02 NOTE — Telephone Encounter (Signed)
Received Colonoscopy results from [facility], [place]; forwarded to provider/SLS 03/06

## 2019-02-09 ENCOUNTER — Encounter: Payer: Self-pay | Admitting: Family Medicine

## 2019-02-14 ENCOUNTER — Other Ambulatory Visit: Payer: Self-pay | Admitting: Family Medicine

## 2019-02-14 ENCOUNTER — Telehealth: Payer: Self-pay | Admitting: Family Medicine

## 2019-02-14 MED ORDER — VALACYCLOVIR HCL 500 MG PO TABS: 500.0000 mg | ORAL_TABLET | Freq: Every day | ORAL | 3 refills | Status: DC | PRN

## 2019-02-14 NOTE — Telephone Encounter (Signed)
Copied from [place] [phone]. Topic: Quick Communication - See Telephone Encounter >> Feb 14, 2019  2:32 PM Rayann Heman wrote: CRM for notification. See Telephone encounter for: 02/14/19. Pt called and stated that she has been very stressed. Pt is requesting that she be written out of work. Pt would like a back from the nurse regarding. Pt states that Janett Billow Copland is aware of the issues. Please advise

## 2019-02-14 NOTE — Telephone Encounter (Signed)
Refills sent

## 2019-02-14 NOTE — Telephone Encounter (Signed)
Copied from [place] [id number]. Topic: Quick Communication - Rx Refill/Question >> Feb 14, 2019  2:32 PM Rayann Heman wrote: Medication: valACYclovir (VALTREX) 500 MG tablet [[id number]] pt states that she is having a break out and would like this called in as soon as possible. Please (per pt)   Has the patient contacted their pharmacy? Preferred Pharmacy (with phone number or street name):[facility], [place] [phone] (Phone) [phone] (Fax)  Agent: Please be advised that RX refills may take up to 3 business days. We ask that you follow-up with your pharmacy.

## 2019-02-15 ENCOUNTER — Encounter: Payer: Self-pay | Admitting: Family Medicine

## 2019-02-15 ENCOUNTER — Ambulatory Visit (INDEPENDENT_AMBULATORY_CARE_PROVIDER_SITE_OTHER): Payer: BLUE CROSS/BLUE SHIELD | Admitting: Family Medicine

## 2019-02-15 ENCOUNTER — Other Ambulatory Visit: Payer: Self-pay

## 2019-02-15 VITALS — BP 160/100 | HR 89 | Temp 98.2°F | Resp 16 | Ht 68.0 in | Wt 232.0 lb

## 2019-02-15 DIAGNOSIS — I10 Essential (primary) hypertension: Secondary | ICD-10-CM

## 2019-02-15 DIAGNOSIS — B009 Herpesviral infection, unspecified: Secondary | ICD-10-CM

## 2019-02-15 DIAGNOSIS — F430 Acute stress reaction: Secondary | ICD-10-CM

## 2019-02-15 LAB — BASIC METABOLIC PANEL
BUN: 12 mg/dL (ref 6–23)
CO2: 27 mEq/L (ref 19–32)
Calcium: 9.7 mg/dL (ref 8.4–10.5)
Chloride: 99 mEq/L (ref 96–112)
Creatinine, Ser: 0.88 mg/dL (ref 0.40–1.20)
GFR: 64.95 mL/min (ref >60.00)
Glucose, Bld: 117 mg/dL — ABNORMAL HIGH (ref 70–99)
Potassium: 4 mEq/L (ref 3.5–5.1)
Sodium: 135 mEq/L (ref 135–145)

## 2019-02-15 LAB — CBC
HCT: 41.9 % (ref 36.0–46.0)
Hemoglobin: 14.4 g/dL (ref 12.0–15.0)
MCHC: 34.4 g/dL (ref 30.0–36.0)
MCV: 92.5 fl (ref 78.0–100.0)
Platelets: 343 10*3/uL (ref 150.0–400.0)
RBC: 4.53 Mil/uL (ref 3.87–5.11)
RDW: 12 % (ref 11.5–15.5)
WBC: 6.5 10*3/uL (ref 4.0–10.5)

## 2019-02-15 MED ORDER — VALACYCLOVIR HCL 500 MG PO TABS: 500.0000 mg | ORAL_TABLET | Freq: Two times a day (BID) | ORAL | 3 refills | Status: DC

## 2019-02-15 MED ORDER — AMLODIPINE BESYLATE 5 MG PO TABS: 5.0000 mg | ORAL_TABLET | Freq: Every day | ORAL | 3 refills | Status: DC

## 2019-02-15 NOTE — Progress Notes (Signed)
[facility] [place], [place], [place] [address] [phone]  Date:  02/15/2019   Name:  Taylor Morales   DOB:  September 05, 1956   MRN:  [id number]  PCP:  Darreld Mclean, MD    Chief Complaint: Anxiety (wants to be written out of work, lack of provisions at work)   History of Present Illness:  Taylor Morales is a 63 y.o. 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  She works at [employer] She asks me to "write me out for 3 weeks" due to "stress"   She would be willing to work from home but this is not possible  BP is high today-she has also noted her blood pressure running high the last several times she is checked it at home, though she is not sure of the exact numbers She is taking her Hyzaar regularly  BP Readings from Last 3 Encounters:  02/15/19 (!) 160/100  08/31/18 130/72  05/25/17 132/88     Patient Active Problem List   Diagnosis Date Noted  . Pre-diabetes 11/02/2016  . Alcoholism ([place]) 04/17/2015  . Former smoker 04/17/2015  . Obesity 02/27/2010  . FOOT PAIN, RIGHT 05/16/2009  . Hyperlipidemia 09/29/2007  . Depression 09/29/2007  . Essential hypertension 09/29/2007  . Allergic rhinitis 09/29/2007    Past Medical History:  Diagnosis Date  . Allergy    rhinitis  . Depression    stress related in past, on prozac-took self off  . Hyperlipidemia   . Hypertension     Past Surgical History:  Procedure Laterality Date  . BREAST LUMPECTOMY      benign  . CESAREAN SECTION     x2  . NASAL SINUS SURGERY     x2  . TONSILLECTOMY     as child    Social History   Tobacco Use  . Smoking status: Former Smoker    Packs/day: 1.00    Years: 13.00    Pack years: 13.00    Types: Cigarettes    Last attempt to quit: 11/29/1982    Years since quitting: 36.2  Substance Use Topics  . Alcohol use: Yes    Alcohol/week: 0.0 standard drinks    Comment: bottle of wine a night  . Drug use: No    Family History  Problem Relation Age of Onset  . Other Father        unknown  . Lymphoma Mother        around 29  . Breast cancer Mother        age 42  . Lymphoma Maternal Uncle   . Pancreatic cancer Mother        ? pancreatic as had whipple    No Known Allergies  Medication list has been reviewed and updated.  Current Outpatient Medications on File Prior to Visit  Medication Sig Dispense Refill  . cetirizine (ZYRTEC) 10 MG tablet  Take 10 mg by mouth daily.    Marland Kitchen FLUoxetine (PROZAC) 40 MG capsule TAKE 1 CAPSULE BY MOUTH EVERY DAY 90 capsule 1  . fluticasone (FLONASE) 50 MCG/ACT nasal spray Place 2 sprays into both nostrils daily. 16 g 12  . losartan-hydrochlorothiazide (HYZAAR) 100-25 MG tablet Take 1 tablet by mouth daily. 90 tablet 3  . valACYclovir (VALTREX) 500 MG tablet Take 1 tablet (500 mg total) by mouth daily as needed. 30 tablet 3  . [DISCONTINUED] fluticasone (VERAMYST) 27.5 MCG/SPRAY nasal spray Place 1 spray into the nose 2 (two) times daily. 30 g 3   No current facility-administered medications on file prior to visit.     Review of Systems:  As per HPI- otherwise negative.  No fever chills Physical Examination: Vitals:   02/15/19 0957 02/15/19 1020  BP: (!) 170/110 (!) 160/100  Pulse: 89   Resp: 16   Temp: 98.2 F (36.8 C)   SpO2: 98%    Vitals:   02/15/19 0957  Weight: 232 lb (105.2 kg)  Height: 5\' 8"  (1.727 m)   Body mass index is 35.28 kg/m. Ideal Body Weight: Weight in (lb) to have BMI = 25:  164.1  GEN: WDWN, NAD, Non-toxic, A & O x 3, obese, looks well HEENT: Atraumatic, Normocephalic. Neck supple. No masses, No LAD.  Bilateral TM wnl, oropharynx normal.  PEERL,EOMI.   Ears and Nose: No external deformity. CV: RRR, No M/G/R. No JVD. No thrill. No extra heart sounds. PULM: CTA B, no wheezes, crackles, rhonchi. No retractions. No resp. distress. No accessory muscle use. EXTR: No c/c/e NEURO Normal gait.  PSYCH: Normally interactive. Conversant. Not depressed or anxious appearing.  Calm demeanor.    Assessment and Plan: Essential hypertension, benign - Plan: amLODipine (NORVASC) 5 MG tablet, Basic metabolic panel, CBC  HSV-2 (herpes simplex virus 2) infection - Plan: valACYclovir (VALTREX) 500 MG tablet  Acute stress reaction   Blood pressure is elevated today, patient is taking her medication.  We will add 5 mg amlodipine.  She will monitor home blood pressure and let me know how it responds Refilled her Valtrex to use as needed for flare and also for suppression I did provide a note for her job, though I have advised her I cannot guarantee this will be accepted as job protection  Signed Lamar Blinks, MD   Received her labs, letter to pt   Results for orders placed or performed in visit on 28/78/67  Basic metabolic panel  Result Value Ref Range   Sodium 135 135 - 145 mEq/L   Potassium 4.0 3.5 - 5.1 mEq/L   Chloride 99 96 - 112 mEq/L   CO2 27 19 - 32 mEq/L   Glucose, Bld 117 (H) 70 - 99 mg/dL   BUN 12 6 - 23 mg/dL   Creatinine, Ser 0.88 0.40 - 1.20 mg/dL   Calcium 9.7 8.4 - 10.5 mg/dL   GFR 64.95 >60.00 mL/min  CBC  Result Value Ref Range   WBC 6.5 4.0 - 10.5 K/uL   RBC 4.53 3.87 - 5.11 Mil/uL   Platelets 343.0 150.0 - 400.0 K/uL   Hemoglobin 14.4 12.0 - 15.0 g/dL   HCT 41.9 36.0 - 46.0 %   MCV 92.5 78.0 - 100.0 fl   MCHC 34.4 30.0 - 36.0 g/dL   RDW 12.0 11.5 - 15.5 %

## 2019-02-15 NOTE — Telephone Encounter (Signed)
Scheduled patient appointment to come in today at 9:45.Marland Kitchenexplained to her that we are unable to write her out of work without seeing her but we would be happy to discuss at ov.

## 2019-02-15 NOTE — Patient Instructions (Addendum)
It was good to see you - hang in there I gave you a note for work as you requested.  You might look into FLMA  I refilled your valtrex- take it twice a day for 3 days, then daily for suppression as desired  I also had you add 5 mg of amlodipine to your BP regimen - please let me know how your BP responds to this change   We will check basic labs today

## 2019-02-26 ENCOUNTER — Telehealth: Payer: Self-pay | Admitting: *Deleted

## 2019-02-26 NOTE — Telephone Encounter (Signed)
Received Restrictions Form paperwork from [place]; attached office visit notes to be forwarded with forms; forwarded to provider/SLS 03/30

## 2019-03-08 ENCOUNTER — Telehealth: Payer: Self-pay | Admitting: *Deleted

## 2019-03-08 NOTE — Telephone Encounter (Signed)
Received Disability Restrictions Form paperwork from [place]; forwarded to provider with requested copy of OV notes & labs/SLS 04/09

## 2019-04-16 ENCOUNTER — Other Ambulatory Visit: Payer: Self-pay | Admitting: Family Medicine

## 2019-04-16 MED ORDER — LOSARTAN POTASSIUM-HCTZ 100-25 MG PO TABS: 1.0000 | ORAL_TABLET | Freq: Every day | ORAL | 3 refills | Status: DC

## 2019-04-16 NOTE — Telephone Encounter (Signed)
Refills sent

## 2019-04-16 NOTE — Telephone Encounter (Signed)
Copied from [place] [phone]. Topic: Quick Communication - Rx Refill/Question >> Apr 16, 2019  1:02 PM Taylor Morales wrote: Medication: losartan-hydrochlorothiazide (HYZAAR) 100-25 MG tablet  Patient is requesting refill of this medication. She has one tablet left.   Preferred Pharmacy (with phone number or street name):[id number] - [place], [place] [phone] (Phone) [phone] (Fax)

## 2019-08-08 ENCOUNTER — Encounter: Payer: Self-pay | Admitting: Family Medicine

## 2019-08-08 ENCOUNTER — Telehealth: Payer: Self-pay | Admitting: Family Medicine

## 2019-08-08 NOTE — Telephone Encounter (Signed)
Patient has upcoming appointment on 9/14 would you still need to see her for acute issue first prior to prescribing medication?

## 2019-08-08 NOTE — Telephone Encounter (Signed)
Patient calling to inquire if Dr. Lorelei Pont would be willing to send her an anti diarrhea medication into pharmacy. Patient is scheduled for CPE on Monday 9/14.

## 2019-08-09 NOTE — Patient Instructions (Addendum)
It was nice to see you today, I will be in touch with your labs ASAP Flu shot today We will do shingrix at a later date We will look for any explanation for your stool changes- I will be in touch asap Let me know if getting worse in the meantime    Health Maintenance, Female Adopting a healthy lifestyle and getting preventive care are important in promoting health and wellness. Ask your health care provider about:  The right schedule for you to have regular tests and exams.  Things you can do on your own to prevent diseases and keep yourself healthy. What should I know about diet, weight, and exercise? Eat a healthy diet   Eat a diet that includes plenty of vegetables, fruits, low-fat dairy products, and lean protein.  Do not eat a lot of foods that are high in solid fats, added sugars, or sodium. Maintain a healthy weight Body mass index (BMI) is used to identify weight problems. It estimates body fat based on height and weight. Your health care provider can help determine your BMI and help you achieve or maintain a healthy weight. Get regular exercise Get regular exercise. This is one of the most important things you can do for your health. Most adults should:  Exercise for at least 150 minutes each week. The exercise should increase your heart rate and make you sweat (moderate-intensity exercise).  Do strengthening exercises at least twice a week. This is in addition to the moderate-intensity exercise.  Spend less time sitting. Even light physical activity can be beneficial. Watch cholesterol and blood lipids Have your blood tested for lipids and cholesterol at 63 years of age, then have this test every 5 years. Have your cholesterol levels checked more often if:  Your lipid or cholesterol levels are high.  You are older than 63 years of age.  You are at high risk for heart disease. What should I know about cancer screening? Depending on your health history and family  history, you may need to have cancer screening at various ages. This may include screening for:  Breast cancer.  Cervical cancer.  Colorectal cancer.  Skin cancer.  Lung cancer. What should I know about heart disease, diabetes, and high blood pressure? Blood pressure and heart disease  High blood pressure causes heart disease and increases the risk of stroke. This is more likely to develop in people who have high blood pressure readings, are of African descent, or are overweight.  Have your blood pressure checked: ? Every 3-5 years if you are 64-46 years of age. ? Every year if you are 79 years old or older. Diabetes Have regular diabetes screenings. This checks your fasting blood sugar level. Have the screening done:  Once every three years after age 43 if you are at a normal weight and have a low risk for diabetes.  More often and at a younger age if you are overweight or have a high risk for diabetes. What should I know about preventing infection? Hepatitis B If you have a higher risk for hepatitis B, you should be screened for this virus. Talk with your health care provider to find out if you are at risk for hepatitis B infection. Hepatitis C Testing is recommended for:  Everyone born from [birth year] through 1965.  Anyone with known risk factors for hepatitis C. Sexually transmitted infections (STIs)  Get screened for STIs, including gonorrhea and chlamydia, if: ? You are sexually active and are younger than 63 years  of age. ? You are older than 63 years of age and your health care provider tells you that you are at risk for this type of infection. ? Your sexual activity has changed since you were last screened, and you are at increased risk for chlamydia or gonorrhea. Ask your health care provider if you are at risk.  Ask your health care provider about whether you are at high risk for HIV. Your health care provider may recommend a prescription medicine to help prevent HIV  infection. If you choose to take medicine to prevent HIV, you should first get tested for HIV. You should then be tested every 3 months for as long as you are taking the medicine. Pregnancy  If you are about to stop having your period (premenopausal) and you may become pregnant, seek counseling before you get pregnant.  Take 400 to 800 micrograms (mcg) of folic acid every day if you become pregnant.  Ask for birth control (contraception) if you want to prevent pregnancy. Osteoporosis and menopause Osteoporosis is a disease in which the bones lose minerals and strength with aging. This can result in bone fractures. If you are 86 years old or older, or if you are at risk for osteoporosis and fractures, ask your health care provider if you should:  Be screened for bone loss.  Take a calcium or vitamin D supplement to lower your risk of fractures.  Be given hormone replacement therapy (HRT) to treat symptoms of menopause. Follow these instructions at home: Lifestyle  Do not use any products that contain nicotine or tobacco, such as cigarettes, e-cigarettes, and chewing tobacco. If you need help quitting, ask your health care provider.  Do not use street drugs.  Do not share needles.  Ask your health care provider for help if you need support or information about quitting drugs. Alcohol use  Do not drink alcohol if: ? Your health care provider tells you not to drink. ? You are pregnant, may be pregnant, or are planning to become pregnant.  If you drink alcohol: ? Limit how much you use to 0-1 drink a day. ? Limit intake if you are breastfeeding.  Be aware of how much alcohol is in your drink. In the [place] one drink equals one 12 oz bottle of beer (355 mL), one 5 oz glass of wine (148 mL), or one 1 oz glass of hard liquor (44 mL). General instructions  Schedule regular health, dental, and eye exams.  Stay current with your vaccines.  Tell your health care provider if: ? You  often feel depressed. ? You have ever been abused or do not feel safe at home. Summary  Adopting a healthy lifestyle and getting preventive care are important in promoting health and wellness.  Follow your health care provider's instructions about healthy diet, exercising, and getting tested or screened for diseases.  Follow your health care provider's instructions on monitoring your cholesterol and blood pressure. This information is not intended to replace advice given to you by your health care provider. Make sure you discuss any questions you have with your health care provider. Document Released: 05/31/2011 Document Revised: 11/08/2018 Document Reviewed: 11/08/2018 Elsevier Patient Education  2020 [employer].

## 2019-08-09 NOTE — Progress Notes (Addendum)
[facility] [place], [place], [place] [address] [phone]  Date:  08/13/2019   Name:  Taylor Morales   DOB:  [birth year]   MRN:  [id number]  PCP:  Darreld Mclean, MD    Chief Complaint: Annual Exam and Diarrhea (bloating, cramping, 4 weeks, no fever, no vomiting, taken immodium pepto did not help)   History of Present Illness:  Taylor Morales is a 63 y.o. very pleasant female patient who presents with the following:  Here today for complete physical History of hypertension, dyslipidemia, prediabetes, depression, alcohol abuse, HSV Former smoker, quit more than 35 years ago and had less than 15-pack-year history  Last visit in March of this year At that time her blood pressure slightly high, we added amlodipine 5 mg to her regimen She is tolerating this well and her BP has improved   She feels fine except for her bowels- she has noted frequent stools and diarrhea for about a month For a while she had to have a stool right after eating.  Then became more random- and might even wake her up at night She has had a couple of episodes of having to find a place to go to the bathroom urgently, nearly had an accident   She has not noted any blood in her stool- did see mucus once No travel No recent abx use No suspicious foods or water consumed No fever No nausea or vomiting She has tried some imodium that did help some perhaps  She is not really aware or any specific food intolerance She has tried cutting down on dairy at least some, but has not truly eliminated it yet   Flu shot today Suggest Shingrix- she plans to do later on  Mammogram due this month- scheduled  Most recent labs about 1 year ago, can update today. Fasting today  colonosopy done earlier this year- ok.  Showed one polyp which was resected  Patient Active Problem List   Diagnosis Date Noted  . Pre-diabetes 11/02/2016  . Alcoholism ([place])  04/17/2015  . Former smoker 04/17/2015  . Obesity 02/27/2010  . FOOT PAIN, RIGHT 05/16/2009  . Hyperlipidemia 09/29/2007  . Depression 09/29/2007  . Essential hypertension 09/29/2007  . Allergic rhinitis 09/29/2007    Past Medical History:  Diagnosis Date  . Allergy    rhinitis  . Depression    stress related in past, on prozac-took self off  . Hyperlipidemia   . Hypertension     Past Surgical History:  Procedure Laterality Date  . BREAST LUMPECTOMY     benign  . CESAREAN SECTION     x2  . NASAL SINUS SURGERY     x2  . TONSILLECTOMY     as child    Social History   Tobacco Use  . Smoking status: Former Smoker    Packs/day: 1.00    Years: 13.00    Pack years: 13.00    Types: Cigarettes    Quit date: 11/29/1982    Years since quitting: 36.7  . Smokeless tobacco: Current User  Substance Use Topics  . Alcohol use: Yes    Alcohol/week: 0.0 standard drinks    Comment: bottle of wine a night  . Drug use: No    Family History  Problem Relation Age of Onset  . Other Father        unknown  . Lymphoma Mother  around 5  . Breast cancer Mother        age 75  . Lymphoma Maternal Uncle   . Pancreatic cancer Mother        ? pancreatic as had whipple    No Known Allergies  Medication list has been reviewed and updated.  Current Outpatient Medications on File Prior to Visit  Medication Sig Dispense Refill  . amLODipine (NORVASC) 5 MG tablet Take 1 tablet (5 mg total) by mouth daily. 90 tablet 3  . cetirizine (ZYRTEC) 10 MG tablet Take 10 mg by mouth daily.    Marland Kitchen FLUoxetine (PROZAC) 40 MG capsule TAKE 1 CAPSULE BY MOUTH EVERY DAY 90 capsule 1  . fluticasone (FLONASE) 50 MCG/ACT nasal spray Place 2 sprays into both nostrils daily. 16 g 12  . losartan-hydrochlorothiazide (HYZAAR) 100-25 MG tablet Take 1 tablet by mouth daily. 90 tablet 3  . valACYclovir (VALTREX) 500 MG tablet Take 1 tablet (500 mg total) by mouth 2 (two) times daily. Take twice a day for 3  days as needed for outbreak, then daily for suppression 90 tablet 3  . [DISCONTINUED] fluticasone (VERAMYST) 27.5 MCG/SPRAY nasal spray Place 1 spray into the nose 2 (two) times daily. 30 g 3   No current facility-administered medications on file prior to visit.     Review of Systems:  As per HPI- otherwise negative. No CP or SOB  No PMB   Physical Examination: Vitals:   08/13/19 0924  BP: 130/90  Pulse: 77  Resp: 16  Temp: (!) 97 F (36.1 C)  SpO2: 98%   Vitals:   08/13/19 0924  Weight: 235 lb (106.6 kg)  Height: 5\' 8"  (1.727 m)   Body mass index is 35.73 kg/m. Ideal Body Weight: Weight in (lb) to have BMI = 25: 164.1  GEN: WDWN, NAD, Non-toxic, A & O x 3, obese, looks well  HEENT: Atraumatic, Normocephalic. Neck supple. No masses, No LAD. Ears and Nose: No external deformity. CV: RRR, No M/G/R. No JVD. No thrill. No extra heart sounds. PULM: CTA B, no wheezes, crackles, rhonchi. No retractions. No resp. distress. No accessory muscle use. ABD: S, NT, ND, +BS. No rebound. No HSM.  Benign belly  EXTR: No c/c/e NEURO Normal gait.  PSYCH: Normally interactive. Conversant. Not depressed or anxious appearing.  Calm demeanor.    Assessment and Plan: Physical exam  Essential hypertension, benign - Plan: CBC, Comprehensive metabolic panel  Pre-diabetes - Plan: Hemoglobin A1c  Acute stress reaction  Alcoholism ([place])  Screening for breast cancer  Hyperlipidemia, unspecified hyperlipidemia type - Plan: Lipid panel  Screening for thyroid disorder - Plan: TSH  Diarrhea, unspecified type - Plan: C. difficile GDH and Toxin A/B, Stool culture  Moderate episode of recurrent major depressive disorder ([place]) - Plan: FLUoxetine (PROZAC) 40 MG capsule  CPE today Labs pending as above Refilled fluoxetine which is working well for her depression sx She notes fatigue- check TSH - if all normal consider OSA test  Stool studies pending. Recent colon OK   Signed Lamar Blinks, MD  Received her labs, message to patient Results for orders placed or performed in visit on 08/13/19  CBC  Result Value Ref Range   WBC 7.8 4.0 - 10.5 K/uL   RBC 4.24 3.87 - 5.11 Mil/uL   Platelets 370.0 150.0 - 400.0 K/uL   Hemoglobin 13.6 12.0 - 15.0 g/dL   HCT 40.1 36.0 - 46.0 %   MCV 94.4 78.0 - 100.0 fl   MCHC  34.0 30.0 - 36.0 g/dL   RDW 12.6 11.5 - 15.5 %  Comprehensive metabolic panel  Result Value Ref Range   Sodium 135 135 - 145 mEq/L   Potassium 3.5 3.5 - 5.1 mEq/L   Chloride 99 96 - 112 mEq/L   CO2 26 19 - 32 mEq/L   Glucose, Bld 107 (H) 70 - 99 mg/dL   BUN 14 6 - 23 mg/dL   Creatinine, Ser 0.78 0.40 - 1.20 mg/dL   Total Bilirubin 0.6 0.2 - 1.2 mg/dL   Alkaline Phosphatase 70 39 - 117 U/L   AST 16 0 - 37 U/L   ALT 17 0 - 35 U/L   Total Protein 6.7 6.0 - 8.3 g/dL   Albumin 4.2 3.5 - 5.2 g/dL   Calcium 9.5 8.4 - 10.5 mg/dL   GFR 74.53 >60.00 mL/min  Hemoglobin A1c  Result Value Ref Range   Hgb A1c MFr Bld 6.1 4.6 - 6.5 %  Lipid panel  Result Value Ref Range   Cholesterol 207 (H) 0 - 200 mg/dL   Triglycerides 142.0 0.0 - 149.0 mg/dL   HDL 60.80 >39.00 mg/dL   VLDL 28.4 0.0 - 40.0 mg/dL   LDL Cholesterol 118 (H) 0 - 99 mg/dL   Total CHOL/HDL Ratio 3    NonHDL 145.96   TSH  Result Value Ref Range   TSH 2.20 0.35 - 4.50 uIU/mL

## 2019-08-10 NOTE — Telephone Encounter (Signed)
Pt called back in to follow up. Advised pt per PCP my-chart message to her. Pt expressed understanding.

## 2019-08-13 ENCOUNTER — Ambulatory Visit (INDEPENDENT_AMBULATORY_CARE_PROVIDER_SITE_OTHER): Payer: BC Managed Care – PPO | Admitting: Family Medicine

## 2019-08-13 ENCOUNTER — Encounter: Payer: Self-pay | Admitting: Family Medicine

## 2019-08-13 ENCOUNTER — Other Ambulatory Visit: Payer: Self-pay

## 2019-08-13 VITALS — BP 130/90 | HR 77 | Temp 97.0°F | Resp 16 | Ht 68.0 in | Wt 235.0 lb

## 2019-08-13 DIAGNOSIS — I10 Essential (primary) hypertension: Secondary | ICD-10-CM | POA: Diagnosis not present

## 2019-08-13 DIAGNOSIS — Z1329 Encounter for screening for other suspected endocrine disorder: Secondary | ICD-10-CM

## 2019-08-13 DIAGNOSIS — Z0000 Encounter for general adult medical examination without abnormal findings: Secondary | ICD-10-CM

## 2019-08-13 DIAGNOSIS — F1020 Alcohol dependence, uncomplicated: Secondary | ICD-10-CM

## 2019-08-13 DIAGNOSIS — R7303 Prediabetes: Secondary | ICD-10-CM

## 2019-08-13 DIAGNOSIS — E785 Hyperlipidemia, unspecified: Secondary | ICD-10-CM | POA: Diagnosis not present

## 2019-08-13 DIAGNOSIS — Z23 Encounter for immunization: Secondary | ICD-10-CM | POA: Diagnosis not present

## 2019-08-13 DIAGNOSIS — Z1239 Encounter for other screening for malignant neoplasm of breast: Secondary | ICD-10-CM

## 2019-08-13 DIAGNOSIS — R197 Diarrhea, unspecified: Secondary | ICD-10-CM

## 2019-08-13 DIAGNOSIS — F430 Acute stress reaction: Secondary | ICD-10-CM

## 2019-08-13 DIAGNOSIS — F331 Major depressive disorder, recurrent, moderate: Secondary | ICD-10-CM

## 2019-08-13 LAB — CBC
HCT: 40.1 % (ref 36.0–46.0)
Hemoglobin: 13.6 g/dL (ref 12.0–15.0)
MCHC: 34 g/dL (ref 30.0–36.0)
MCV: 94.4 fl (ref 78.0–100.0)
Platelets: 370 10*3/uL (ref 150.0–400.0)
RBC: 4.24 Mil/uL (ref 3.87–5.11)
RDW: 12.6 % (ref 11.5–15.5)
WBC: 7.8 10*3/uL (ref 4.0–10.5)

## 2019-08-13 LAB — COMPREHENSIVE METABOLIC PANEL
ALT: 17 U/L (ref 0–35)
AST: 16 U/L (ref 0–37)
Albumin: 4.2 g/dL (ref 3.5–5.2)
Alkaline Phosphatase: 70 U/L (ref 39–117)
BUN: 14 mg/dL (ref 6–23)
CO2: 26 mEq/L (ref 19–32)
Calcium: 9.5 mg/dL (ref 8.4–10.5)
Chloride: 99 mEq/L (ref 96–112)
Creatinine, Ser: 0.78 mg/dL (ref 0.40–1.20)
GFR: 74.53 mL/min (ref >60.00)
Glucose, Bld: 107 mg/dL — ABNORMAL HIGH (ref 70–99)
Potassium: 3.5 mEq/L (ref 3.5–5.1)
Sodium: 135 mEq/L (ref 135–145)
Total Bilirubin: 0.6 mg/dL (ref 0.2–1.2)
Total Protein: 6.7 g/dL (ref 6.0–8.3)

## 2019-08-13 LAB — TSH: TSH: 2.2 u[IU]/mL (ref 0.35–4.50)

## 2019-08-13 LAB — LIPID PANEL
Cholesterol: 207 mg/dL — ABNORMAL HIGH (ref 0–200)
HDL: 60.8 mg/dL (ref >39.00)
LDL Cholesterol: 118 mg/dL — ABNORMAL HIGH (ref 0–99)
NonHDL: 145.96
Total CHOL/HDL Ratio: 3
Triglycerides: 142 mg/dL (ref 0.0–149.0)
VLDL: 28.4 mg/dL (ref 0.0–40.0)

## 2019-08-13 LAB — HEMOGLOBIN A1C: Hgb A1c MFr Bld: 6.1 % (ref 4.6–6.5)

## 2019-08-13 MED ORDER — FLUOXETINE HCL 40 MG PO CAPS: 40.0000 mg | ORAL_CAPSULE | Freq: Every day | ORAL | 3 refills | Status: DC

## 2019-08-14 ENCOUNTER — Other Ambulatory Visit: Payer: BC Managed Care – PPO

## 2019-08-14 DIAGNOSIS — R197 Diarrhea, unspecified: Secondary | ICD-10-CM | POA: Diagnosis not present

## 2019-08-14 NOTE — Addendum Note (Signed)
Addended by: Caffie Pinto on: 08/14/2019 11:35 AM   Modules accepted: Orders

## 2019-08-15 ENCOUNTER — Encounter: Payer: Self-pay | Admitting: Family Medicine

## 2019-08-15 LAB — C. DIFFICILE GDH AND TOXIN A/B
GDH ANTIGEN: NOT DETECTED
MICRO NUMBER:: 883201
SPECIMEN QUALITY:: ADEQUATE
TOXIN A AND B: NOT DETECTED

## 2019-08-18 LAB — STOOL CULTURE
MICRO NUMBER:: 882826
MICRO NUMBER:: 882827
MICRO NUMBER:: 882828
SHIGA RESULT:: NOT DETECTED
SPECIMEN QUALITY:: ADEQUATE
SPECIMEN QUALITY:: ADEQUATE
SPECIMEN QUALITY:: ADEQUATE

## 2019-08-19 ENCOUNTER — Encounter: Payer: Self-pay | Admitting: Family Medicine

## 2019-08-19 NOTE — Progress Notes (Signed)
Received her stool cultures, message to patient Results for orders placed or performed in visit on 08/14/19  Stool culture   Specimen: Stool  Result Value Ref Range   MICRO NUMBER: [id number]    SPECIMEN QUALITY: Adequate    SOURCE: STOOL    STATUS: FINAL    SHIGA RESULT: Not Detected    MICRO NUMBER: [id number]    SPECIMEN QUALITY: Adequate    Source STOOL    STATUS: FINAL    CAM RESULT: No enteric Campylobacter isolated    MICRO NUMBER: [id number]    SPECIMEN QUALITY: Adequate    SOURCE: STOOL    STATUS: FINAL    SS RESULT: No Salmonella or Shigella isolated   C. difficile GDH and Toxin A/B  Result Value Ref Range   MICRO NUMBER: [id number]    SPECIMEN QUALITY: Adequate    Source STOOL    STATUS: FINAL    GDH ANTIGEN Not Detected    TOXIN A AND B Not Detected    COMMENT      No toxigenic C. difficile detected For additional information, please refer to http://education.[facility].com/faq/[email or web address] (This link is being provided for informational/educational purposes only.)

## 2019-08-24 DIAGNOSIS — Z1231 Encounter for screening mammogram for malignant neoplasm of breast: Secondary | ICD-10-CM | POA: Diagnosis not present

## 2019-08-24 DIAGNOSIS — Z803 Family history of malignant neoplasm of breast: Secondary | ICD-10-CM | POA: Diagnosis not present

## 2020-01-11 ENCOUNTER — Encounter: Payer: Self-pay | Admitting: Family Medicine

## 2020-01-11 DIAGNOSIS — L821 Other seborrheic keratosis: Secondary | ICD-10-CM | POA: Diagnosis not present

## 2020-01-11 DIAGNOSIS — L570 Actinic keratosis: Secondary | ICD-10-CM | POA: Diagnosis not present

## 2020-01-11 DIAGNOSIS — L98499 Non-pressure chronic ulcer of skin of other sites with unspecified severity: Secondary | ICD-10-CM | POA: Diagnosis not present

## 2020-01-11 DIAGNOSIS — L2089 Other atopic dermatitis: Secondary | ICD-10-CM | POA: Diagnosis not present

## 2020-01-11 DIAGNOSIS — D485 Neoplasm of uncertain behavior of skin: Secondary | ICD-10-CM | POA: Diagnosis not present

## 2020-02-23 ENCOUNTER — Other Ambulatory Visit: Payer: Self-pay

## 2020-02-23 ENCOUNTER — Emergency Department (HOSPITAL_COMMUNITY): Payer: BC Managed Care – PPO

## 2020-02-23 ENCOUNTER — Emergency Department (HOSPITAL_COMMUNITY)
Admission: EM | Admit: 2020-02-23 | Discharge: 2020-02-24 | Disposition: A | Discharge status: Home / Self Care | Payer: BC Managed Care – PPO | Attending: Emergency Medicine | Admitting: Emergency Medicine

## 2020-02-23 ENCOUNTER — Encounter (HOSPITAL_COMMUNITY): Payer: Self-pay | Admitting: Emergency Medicine

## 2020-02-23 DIAGNOSIS — Z87891 Personal history of nicotine dependence: Secondary | ICD-10-CM | POA: Insufficient documentation

## 2020-02-23 DIAGNOSIS — R1012 Left upper quadrant pain: Secondary | ICD-10-CM | POA: Diagnosis not present

## 2020-02-23 DIAGNOSIS — I10 Essential (primary) hypertension: Secondary | ICD-10-CM | POA: Insufficient documentation

## 2020-02-23 DIAGNOSIS — Z79899 Other long term (current) drug therapy: Secondary | ICD-10-CM | POA: Insufficient documentation

## 2020-02-23 DIAGNOSIS — R0789 Other chest pain: Secondary | ICD-10-CM | POA: Insufficient documentation

## 2020-02-23 DIAGNOSIS — R079 Chest pain, unspecified: Secondary | ICD-10-CM | POA: Diagnosis not present

## 2020-02-23 DIAGNOSIS — S299XXA Unspecified injury of thorax, initial encounter: Secondary | ICD-10-CM | POA: Diagnosis not present

## 2020-02-23 DIAGNOSIS — S3991XA Unspecified injury of abdomen, initial encounter: Secondary | ICD-10-CM | POA: Diagnosis not present

## 2020-02-23 LAB — CBC WITH DIFFERENTIAL/PLATELET
Abs Immature Granulocytes: 0.06 10*3/uL (ref 0.00–0.07)
Basophils Absolute: 0.1 10*3/uL (ref 0.0–0.1)
Basophils Relative: 1 %
Eosinophils Absolute: 0.2 10*3/uL (ref 0.0–0.5)
Eosinophils Relative: 2 %
HCT: 41.2 % (ref 36.0–46.0)
Hemoglobin: 14.1 g/dL (ref 12.0–15.0)
Immature Granulocytes: 0 %
Lymphocytes Relative: 10 %
Lymphs Abs: 1.5 10*3/uL (ref 0.7–4.0)
MCH: 33 pg (ref 26.0–34.0)
MCHC: 34.2 g/dL (ref 30.0–36.0)
MCV: 96.5 fL (ref 80.0–100.0)
Monocytes Absolute: 0.7 10*3/uL (ref 0.1–1.0)
Monocytes Relative: 5 %
Neutro Abs: 12.4 10*3/uL — ABNORMAL HIGH (ref 1.7–7.7)
Neutrophils Relative %: 82 %
Platelets: 352 10*3/uL (ref 150–400)
RBC: 4.27 MIL/uL (ref 3.87–5.11)
RDW: 11.8 % (ref 11.5–15.5)
WBC: 15 10*3/uL — ABNORMAL HIGH (ref 4.0–10.5)
nRBC: 0 % (ref 0.0–0.2)

## 2020-02-23 LAB — BASIC METABOLIC PANEL
Anion gap: 11 (ref 5–15)
BUN: 17 mg/dL (ref 8–23)
CO2: 24 mmol/L (ref 22–32)
Calcium: 9.4 mg/dL (ref 8.9–10.3)
Chloride: 99 mmol/L (ref 98–111)
Creatinine, Ser: 0.89 mg/dL (ref 0.44–1.00)
GFR calc Af Amer: >60 mL/min (ref >60)
GFR calc non Af Amer: >60 mL/min (ref >60)
Glucose, Bld: 128 mg/dL — ABNORMAL HIGH (ref 70–99)
Potassium: 3.3 mmol/L — ABNORMAL LOW (ref 3.5–5.1)
Sodium: 134 mmol/L — ABNORMAL LOW (ref 135–145)

## 2020-02-23 MED ORDER — SODIUM CHLORIDE 0.9 % IV BOLUS
1000.0000 mL | Freq: Once | INTRAVENOUS | Status: AC
  Administered 2020-02-23: 1000 mL via INTRAVENOUS

## 2020-02-23 MED ORDER — IOHEXOL 300 MG/ML  SOLN
100.0000 mL | Freq: Once | INTRAMUSCULAR | Status: AC | PRN
  Administered 2020-02-23: 100 mL via INTRAVENOUS

## 2020-02-23 MED ORDER — MORPHINE SULFATE (PF) 4 MG/ML IV SOLN
4.0000 mg | Freq: Once | INTRAVENOUS | Status: AC
  Administered 2020-02-23: 4 mg via INTRAVENOUS
  Filled 2020-02-23: qty 1

## 2020-02-23 MED ORDER — SODIUM CHLORIDE (PF) 0.9 % IJ SOLN
INTRAMUSCULAR | Status: AC
  Filled 2020-02-23: qty 50

## 2020-02-23 MED ORDER — ONDANSETRON HCL 4 MG/2ML IJ SOLN
4.0000 mg | Freq: Once | INTRAMUSCULAR | Status: AC
  Administered 2020-02-23: 4 mg via INTRAVENOUS
  Filled 2020-02-23: qty 2

## 2020-02-23 MED ORDER — HYDROCODONE-ACETAMINOPHEN 5-325 MG PO TABS: 1.0000 | ORAL_TABLET | Freq: Four times a day (QID) | ORAL | 0 refills | Status: DC | PRN

## 2020-02-23 MED ORDER — OXYCODONE-ACETAMINOPHEN 5-325 MG PO TABS: 1.0000 | ORAL_TABLET | Freq: Once | ORAL | Status: DC

## 2020-02-23 MED ORDER — OXYCODONE-ACETAMINOPHEN 5-325 MG PO TABS
2.0000 | ORAL_TABLET | Freq: Once | ORAL | Status: AC
  Administered 2020-02-24: 2 via ORAL
  Filled 2020-02-23: qty 2
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
## 2020-02-23 NOTE — ED Notes (Signed)
Pt in CT.

## 2020-02-25 ENCOUNTER — Encounter: Payer: Self-pay | Admitting: Family Medicine

## 2020-02-29 ENCOUNTER — Encounter (HOSPITAL_COMMUNITY): Payer: Self-pay

## 2020-02-29 ENCOUNTER — Emergency Department (HOSPITAL_COMMUNITY): Payer: BC Managed Care – PPO

## 2020-02-29 ENCOUNTER — Emergency Department (HOSPITAL_COMMUNITY)
Admission: EM | Admit: 2020-02-29 | Discharge: 2020-02-29 | Disposition: A | Discharge status: Home / Self Care | Payer: BC Managed Care – PPO | Attending: Emergency Medicine | Admitting: Emergency Medicine

## 2020-02-29 ENCOUNTER — Other Ambulatory Visit: Payer: Self-pay

## 2020-02-29 DIAGNOSIS — Y999 Unspecified external cause status: Secondary | ICD-10-CM | POA: Diagnosis not present

## 2020-02-29 DIAGNOSIS — I10 Essential (primary) hypertension: Secondary | ICD-10-CM | POA: Insufficient documentation

## 2020-02-29 DIAGNOSIS — F17200 Nicotine dependence, unspecified, uncomplicated: Secondary | ICD-10-CM | POA: Insufficient documentation

## 2020-02-29 DIAGNOSIS — Y92410 Unspecified street and highway as the place of occurrence of the external cause: Secondary | ICD-10-CM | POA: Insufficient documentation

## 2020-02-29 DIAGNOSIS — S2232XA Fracture of one rib, left side, initial encounter for closed fracture: Secondary | ICD-10-CM | POA: Insufficient documentation

## 2020-02-29 DIAGNOSIS — Y939 Activity, unspecified: Secondary | ICD-10-CM | POA: Diagnosis not present

## 2020-02-29 DIAGNOSIS — S299XXA Unspecified injury of thorax, initial encounter: Secondary | ICD-10-CM | POA: Diagnosis not present

## 2020-02-29 LAB — COMPREHENSIVE METABOLIC PANEL
ALT: 42 U/L (ref 0–44)
AST: 31 U/L (ref 15–41)
Albumin: 4.2 g/dL (ref 3.5–5.0)
Alkaline Phosphatase: 81 U/L (ref 38–126)
Anion gap: 12 (ref 5–15)
BUN: 15 mg/dL (ref 8–23)
CO2: 26 mmol/L (ref 22–32)
Calcium: 9.5 mg/dL (ref 8.9–10.3)
Chloride: 100 mmol/L (ref 98–111)
Creatinine, Ser: 0.79 mg/dL (ref 0.44–1.00)
GFR calc Af Amer: >60 mL/min (ref >60)
GFR calc non Af Amer: >60 mL/min (ref >60)
Glucose, Bld: 108 mg/dL — ABNORMAL HIGH (ref 70–99)
Potassium: 3.8 mmol/L (ref 3.5–5.1)
Sodium: 138 mmol/L (ref 135–145)
Total Bilirubin: 0.6 mg/dL (ref 0.3–1.2)
Total Protein: 7.7 g/dL (ref 6.5–8.1)

## 2020-02-29 LAB — CBC WITH DIFFERENTIAL/PLATELET
Abs Immature Granulocytes: 0.04 10*3/uL (ref 0.00–0.07)
Basophils Absolute: 0.1 10*3/uL (ref 0.0–0.1)
Basophils Relative: 1 %
Eosinophils Absolute: 0.3 10*3/uL (ref 0.0–0.5)
Eosinophils Relative: 3 %
HCT: 42.2 % (ref 36.0–46.0)
Hemoglobin: 14.2 g/dL (ref 12.0–15.0)
Immature Granulocytes: 1 %
Lymphocytes Relative: 18 %
Lymphs Abs: 1.6 10*3/uL (ref 0.7–4.0)
MCH: 32.3 pg (ref 26.0–34.0)
MCHC: 33.6 g/dL (ref 30.0–36.0)
MCV: 95.9 fL (ref 80.0–100.0)
Monocytes Absolute: 0.8 10*3/uL (ref 0.1–1.0)
Monocytes Relative: 9 %
Neutro Abs: 5.9 10*3/uL (ref 1.7–7.7)
Neutrophils Relative %: 68 %
Platelets: 396 10*3/uL (ref 150–400)
RBC: 4.4 MIL/uL (ref 3.87–5.11)
RDW: 11.5 % (ref 11.5–15.5)
WBC: 8.7 10*3/uL (ref 4.0–10.5)
nRBC: 0 % (ref 0.0–0.2)

## 2020-02-29 MED ORDER — METHOCARBAMOL 500 MG PO TABS: 1000.0000 mg | ORAL_TABLET | Freq: Four times a day (QID) | ORAL | 0 refills | Status: DC

## 2020-02-29 MED ORDER — HYDROCODONE-ACETAMINOPHEN 5-325 MG PO TABS: ORAL_TABLET | ORAL | 0 refills | Status: DC

## 2020-02-29 MED ORDER — SODIUM CHLORIDE 0.9% FLUSH
3.0000 mL | Freq: Once | INTRAVENOUS | Status: AC
  Administered 2020-02-29: 3 mL via INTRAVENOUS
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
## 2020-03-03 ENCOUNTER — Encounter: Payer: Self-pay | Admitting: Family Medicine

## 2020-03-06 ENCOUNTER — Encounter: Payer: Self-pay | Admitting: Family Medicine

## 2020-03-15 ENCOUNTER — Encounter: Payer: Self-pay | Admitting: Family Medicine

## 2020-03-17 ENCOUNTER — Ambulatory Visit (HOSPITAL_BASED_OUTPATIENT_CLINIC_OR_DEPARTMENT_OTHER)
Admission: RE | Admit: 2020-03-17 | Discharge: 2020-03-17 | Disposition: A | Discharge status: Home / Self Care | Payer: BC Managed Care – PPO | Source: Ambulatory Visit | Attending: Family Medicine | Admitting: Family Medicine

## 2020-03-17 ENCOUNTER — Other Ambulatory Visit: Payer: Self-pay

## 2020-03-17 ENCOUNTER — Ambulatory Visit (INDEPENDENT_AMBULATORY_CARE_PROVIDER_SITE_OTHER): Payer: BC Managed Care – PPO | Admitting: Family Medicine

## 2020-03-17 ENCOUNTER — Encounter: Payer: Self-pay | Admitting: Family Medicine

## 2020-03-17 DIAGNOSIS — S2232XA Fracture of one rib, left side, initial encounter for closed fracture: Secondary | ICD-10-CM | POA: Diagnosis not present

## 2020-03-17 DIAGNOSIS — I10 Essential (primary) hypertension: Secondary | ICD-10-CM | POA: Diagnosis not present

## 2020-03-17 DIAGNOSIS — Z87891 Personal history of nicotine dependence: Secondary | ICD-10-CM | POA: Insufficient documentation

## 2020-03-17 DIAGNOSIS — R0789 Other chest pain: Secondary | ICD-10-CM | POA: Diagnosis not present

## 2020-03-17 DIAGNOSIS — Z041 Encounter for examination and observation following transport accident: Secondary | ICD-10-CM | POA: Diagnosis not present

## 2020-03-17 DIAGNOSIS — S299XXA Unspecified injury of thorax, initial encounter: Secondary | ICD-10-CM | POA: Diagnosis not present

## 2020-03-17 DIAGNOSIS — R079 Chest pain, unspecified: Secondary | ICD-10-CM | POA: Diagnosis not present

## 2020-03-17 MED ORDER — TRAMADOL HCL 50 MG PO TABS: 50.0000 mg | ORAL_TABLET | Freq: Four times a day (QID) | ORAL | 0 refills | Status: AC | PRN
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
## 2020-03-19 ENCOUNTER — Ambulatory Visit: Payer: BC Managed Care – PPO | Admitting: Family Medicine

## 2020-03-25 ENCOUNTER — Encounter: Payer: Self-pay | Admitting: Family Medicine

## 2020-03-25 DIAGNOSIS — L570 Actinic keratosis: Secondary | ICD-10-CM | POA: Diagnosis not present

## 2020-04-07 ENCOUNTER — Encounter: Payer: Self-pay | Admitting: Family Medicine

## 2020-04-29 ENCOUNTER — Other Ambulatory Visit: Payer: Self-pay | Admitting: Family Medicine

## 2020-04-29 DIAGNOSIS — I10 Essential (primary) hypertension: Secondary | ICD-10-CM

## 2020-06-10 ENCOUNTER — Other Ambulatory Visit: Payer: Self-pay | Admitting: Family Medicine

## 2020-09-11 ENCOUNTER — Encounter: Payer: Self-pay | Admitting: Family Medicine

## 2020-09-17 ENCOUNTER — Encounter: Payer: Self-pay | Admitting: Family Medicine

## 2020-09-17 ENCOUNTER — Telehealth: Payer: BC Managed Care – PPO | Admitting: Nurse Practitioner

## 2020-09-17 DIAGNOSIS — R0981 Nasal congestion: Secondary | ICD-10-CM

## 2020-09-17 DIAGNOSIS — R059 Cough, unspecified: Secondary | ICD-10-CM | POA: Diagnosis not present

## 2020-09-17 DIAGNOSIS — E2839 Other primary ovarian failure: Secondary | ICD-10-CM

## 2020-09-17 LAB — HM MAMMOGRAPHY

## 2020-09-17 MED ORDER — BENZONATATE 100 MG PO CAPS: 100.0000 mg | ORAL_CAPSULE | Freq: Three times a day (TID) | ORAL | 0 refills | Status: DC | PRN

## 2020-09-17 NOTE — Progress Notes (Signed)
E-Visit for Corona Virus Screening  Your current symptoms could be consistent with the coronavirus.  Many health care providers can now test patients at their office but not all are.  [facility] has multiple testing sites. For information on our COVID testing locations and hours go to [email or web address]  Need to rule out covid before can recommend any other treatment.  Testing Information: The COVID-19 Community Testing sites are testing BY APPOINTMENT ONLY.  You can schedule online at [email or web address]  If you do not have access to a smart phone or computer you may call [phone] for an appointment.   Additional testing sites in the Community:  . For [facility] Testing sites in [facility]  [email or web address]  . For Pop-up testing sites in [place]  BowlDirectory.[email or web address]  . For [facility] [email or web address]  . For [facility] testing in [place] and [employer] [email or web address]  . For [facility] testing in [facility]   [email or web address]  For  more information about community testing call [phone]   Please quarantine yourself while awaiting your test results. Please stay home for a minimum of 10 days from the first day of illness with improving symptoms and you have had 24 hours of no fever (without the use of Tylenol (Acetaminophen) Motrin (Ibuprofen) or any fever reducing medication).  Also - Do not get tested prior to returning to work because once you have had a positive test the test can stay positive for more than a month in some cases.   You should wear a mask or cloth face covering over your nose and mouth if you must be  around other people or animals, including pets (even at home). Try to stay at least 6 feet away from other people. This will protect the people around you.  Please continue good preventive care measures, including:  frequent hand-washing, avoid touching your face, cover coughs/sneezes, stay out of crowds and keep a 6 foot distance from others.  COVID-19 is a respiratory illness with symptoms that are similar to the flu. Symptoms are typically mild to moderate, but there have been cases of severe illness and death due to the virus.   The following symptoms may appear 2-14 days after exposure: . Fever . Cough . Shortness of breath or difficulty breathing . Chills . Repeated shaking with chills . Muscle pain . Headache . Sore throat . New loss of taste or smell . Fatigue . Congestion or runny nose . Nausea or vomiting . Diarrhea  Go to the nearest hospital ED for assessment if fever/cough/breathlessness are severe or illness seems like a threat to life.  It is vitally important that if you feel that you have an infection such as this virus or any other virus that you stay home and away from places where you may spread it to others.  You should avoid contact with people age 64 and older.   You can use medication such as A prescription cough medication called Tessalon Perles 100 mg. You may take 1-2 capsules every 8 hours as needed for cough  You may also take acetaminophen (Tylenol) as needed for fever.  Reduce your risk of any infection by using the same precautions used for avoiding the common cold or flu:  Marland Kitchen Wash your hands often with soap and warm water for at least 20 seconds.  If soap and water are not readily available, use an alcohol-based hand sanitizer with at least 60% alcohol.  . If coughing or sneezing, cover your mouth and  nose by coughing or sneezing into the elbow areas of your shirt or coat, into a tissue or into your sleeve (not your hands). . Avoid shaking hands with others  and consider head nods or verbal greetings only. . Avoid touching your eyes, nose, or mouth with unwashed hands.  . Avoid close contact with people who are sick. . Avoid places or events with large numbers of people in one location, like concerts or sporting events. . Carefully consider travel plans you have or are making. . If you are planning any travel outside or inside the [place], visit the CDC's Travelers' Health webpage for the latest health notices. . If you have some symptoms but not all symptoms, continue to monitor at home and seek medical attention if your symptoms worsen. . If you are having a medical emergency, call 911.  HOME CARE . Only take medications as instructed by your medical team. . Drink plenty of fluids and get plenty of rest. . A steam or ultrasonic humidifier can help if you have congestion.   GET HELP RIGHT AWAY IF YOU HAVE EMERGENCY WARNING SIGNS** FOR COVID-19. If you or someone is showing any of these signs seek emergency medical care immediately. Call 911 or proceed to your closest emergency facility if: . You develop worsening high fever. . Trouble breathing . Bluish lips or face . Persistent pain or pressure in the chest . New confusion . Inability to wake or stay awake . You cough up blood. . Your symptoms become more severe  **This list is not all possible symptoms. Contact your medical provider for any symptoms that are sever or concerning to you.  MAKE SURE YOU   Understand these instructions.  Will watch your condition.  Will get help right away if you are not doing well or get worse.  Your e-visit answers were reviewed by a board certified advanced clinical practitioner to complete your personal care plan.  Depending on the condition, your plan could have included both over the counter or prescription medications.  If there is a problem please reply once you have received a response from your provider.  Your safety is important to [place].  If you  have drug allergies check your prescription carefully.    You can use MyChart to ask questions about today's visit, request a non-urgent call back, or ask for a work or school excuse for 24 hours related to this e-Visit. If it has been greater than 24 hours you will need to follow up with your provider, or enter a new e-Visit to address those concerns. You will get an e-mail in the next two days asking about your experience.  I hope that your e-visit has been valuable and will speed your recovery. Thank you for using e-visits.   5-10 minutes spent reviewing and documenting in chart.

## 2020-09-18 ENCOUNTER — Telehealth: Payer: BC Managed Care – PPO | Admitting: Emergency Medicine

## 2020-09-18 DIAGNOSIS — R059 Cough, unspecified: Secondary | ICD-10-CM | POA: Diagnosis not present

## 2020-09-18 MED ORDER — BENZONATATE 100 MG PO CAPS: 100.0000 mg | ORAL_CAPSULE | Freq: Two times a day (BID) | ORAL | 0 refills | Status: DC | PRN

## 2020-09-18 MED ORDER — AZITHROMYCIN 250 MG PO TABS: 250.0000 mg | ORAL_TABLET | Freq: Every day | ORAL | 0 refills | Status: DC

## 2020-09-18 NOTE — Progress Notes (Signed)
We are sorry that you are not feeling well.  Here is how we plan to help!  Based on your presentation I believe you most likely have A cough due to bacteria.  When patients have a fever and a productive cough with a change in color or increased sputum production, we are concerned about bacterial bronchitis.  If left untreated it can progress to pneumonia.  If your symptoms do not improve with your treatment plan it is important that you contact your provider.   I have prescribed Azithromyin 250 mg: two tablets now and then one tablet daily for 4 additonal days    In addition you may use A prescription cough medication called Tessalon Perles 100mg . You may take 1-2 capsules every 8 hours as needed for your cough.  It looks like from your visit yesterday, they recommended you obtain a COVID 19 test. I agree with this. You should still get tested to ensure you do not have COVD 19, especially if you are not getting any better.   From your responses in the eVisit questionnaire you describe inflammation in the upper respiratory tract which is causing a significant cough.  This is commonly called Bronchitis and has four common causes:    Allergies  Viral Infections  Acid Reflux  Bacterial Infection Allergies, viruses and acid reflux are treated by controlling symptoms or eliminating the cause. An example might be a cough caused by taking certain blood pressure medications. You stop the cough by changing the medication. Another example might be a cough caused by acid reflux. Controlling the reflux helps control the cough.  USE OF BRONCHODILATOR ("RESCUE") INHALERS: There is a risk from using your bronchodilator too frequently.  The risk is that over-reliance on a medication which only relaxes the muscles surrounding the breathing tubes can reduce the effectiveness of medications prescribed to reduce swelling and congestion of the tubes themselves.  Although you feel brief relief from the bronchodilator  inhaler, your asthma may actually be worsening with the tubes becoming more swollen and filled with mucus.  This can delay other crucial treatments, such as oral steroid medications. If you need to use a bronchodilator inhaler daily, several times per day, you should discuss this with your provider.  There are probably better treatments that could be used to keep your asthma under control.     HOME CARE . Only take medications as instructed by your medical team. . Complete the entire course of an antibiotic. . Drink plenty of fluids and get plenty of rest. . Avoid close contacts especially the very young and the elderly . Cover your mouth if you cough or cough into your sleeve. . Always remember to wash your hands . A steam or ultrasonic humidifier can help congestion.   GET HELP RIGHT AWAY IF: . You develop worsening fever. . You become short of breath . You cough up blood. . Your symptoms persist after you have completed your treatment plan MAKE SURE YOU   Understand these instructions.  Will watch your condition.  Will get help right away if you are not doing well or get worse.  Your e-visit answers were reviewed by a board certified advanced clinical practitioner to complete your personal care plan.  Depending on the condition, your plan could have included both over the counter or prescription medications. If there is a problem please reply  once you have received a response from your provider. Your safety is important to [place].  If you have drug allergies  check your prescription carefully.    You can use MyChart to ask questions about today's visit, request a non-urgent call back, or ask for a work or school excuse for 24 hours related to this e-Visit. If it has been greater than 24 hours you will need to follow up with your provider, or enter a new e-Visit to address those concerns. You will get an e-mail in the next two days asking about your experience.  I hope that your e-visit  has been valuable and will speed your recovery. Thank you for using e-visits.  I spent 5-10 minutes reviewing patient's medical history and chart.

## 2020-09-19 ENCOUNTER — Other Ambulatory Visit: Payer: Self-pay

## 2020-09-19 ENCOUNTER — Telehealth (INDEPENDENT_AMBULATORY_CARE_PROVIDER_SITE_OTHER): Payer: BC Managed Care – PPO | Admitting: Family Medicine

## 2020-09-19 DIAGNOSIS — J40 Bronchitis, not specified as acute or chronic: Secondary | ICD-10-CM | POA: Diagnosis not present

## 2020-09-19 DIAGNOSIS — I10 Essential (primary) hypertension: Secondary | ICD-10-CM | POA: Diagnosis not present

## 2020-09-19 DIAGNOSIS — H109 Unspecified conjunctivitis: Secondary | ICD-10-CM

## 2020-09-19 MED ORDER — METHYLPREDNISOLONE 4 MG PO TABS: ORAL_TABLET | ORAL | 0 refills | Status: DC

## 2020-09-19 MED ORDER — POLYMYXIN B-TRIMETHOPRIM 10000-0.1 UNIT/ML-% OP SOLN: 2.0000 [drp] | Freq: Three times a day (TID) | OPHTHALMIC | 0 refills | Status: DC | PRN

## 2020-09-21 DIAGNOSIS — H109 Unspecified conjunctivitis: Secondary | ICD-10-CM | POA: Insufficient documentation

## 2020-09-21 DIAGNOSIS — J40 Bronchitis, not specified as acute or chronic: Secondary | ICD-10-CM | POA: Insufficient documentation
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
## 2020-09-21 NOTE — Progress Notes (Signed)
Virtual Visit via Video Note  I connected with Taylor Morales on 09/19/20 at  8:00 AM EDT by a video enabled telemedicine application and verified that I am speaking with the correct person using two identifiers.  Location: Patient: home, patient and provider in visit Provider: home   I discussed the limitations of evaluation and management by telemedicine and the availability of in person appointments. The patient expressed understanding and agreed to proceed. S Chism, CMA was able to get the patient set up on a video visit   Subjective:    Patient ID: Taylor Morales, female    DOB: [birth year], 64 y.o.   MRN: [id number]  Chief Complaint  Patient presents with  . Cough    HPI Patient is in today for evaluation of cough and URI symptoms. Patient has been sick for about 8 days. Her husband was sick before her ultimately tested negative for COVID and is responding to azithromycin and steroids. She also notes some swelling and discharge with itching from eyes left>right. No fevers, chills, myalgias noted. Some PND and sore throat are noted.    Past Medical History:  Diagnosis Date  . Allergy    rhinitis  . Depression    stress related in past, on prozac-took self off  . Hyperlipidemia   . Hypertension     Past Surgical History:  Procedure Laterality Date  . BREAST LUMPECTOMY     benign  . CESAREAN SECTION     x2  . NASAL SINUS SURGERY     x2  . TONSILLECTOMY     as child    Family History  Problem Relation Age of Onset  . Lymphoma Mother        around 69  . Breast cancer Mother        age 9  . Pancreatic cancer Mother        ? pancreatic as had whipple  . Other Father        unknown  . Lymphoma Maternal Uncle     Social History   Socioeconomic History  . Marital status: Married    Spouse name: Not on file  . Number of children: Not on file  . Years of education: Not on file  . Highest education level: Not on file  Occupational History  . Not on file    Tobacco Use  . Smoking status: Former Smoker    Packs/day: 1.00    Years: 13.00    Pack years: 13.00    Types: Cigarettes    Quit date: 11/29/1982    Years since quitting: 37.8  . Smokeless tobacco: Current User  Vaping Use  . Vaping Use: Never used  Substance and Sexual Activity  . Alcohol use: Yes    Alcohol/week: 0.0 standard drinks    Comment: bottle of wine a night  . Drug use: No  . Sexual activity: Not on file  Other Topics Concern  . Not on file  Social History Narrative   Married (husband patient of Dr.Hunter). 2 children- 43 and 27 in 2016-daughter lives in town, son in [place]. No grandkids-just granddogs.       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    Hopeful to start sailing again, former camping   Social Determinants of Health   Financial Resource Strain:   . Difficulty of Paying Living Expenses: Not on file  Food Insecurity:   . Worried  About Running Out of Food in the Last Year: Not on file  . Ran Out of Food in the Last Year: Not on file  Transportation Needs:   . Lack of Transportation (Medical): Not on file  . Lack of Transportation (Non-Medical): Not on file  Physical Activity:   . Days of Exercise per Week: Not on file  . Minutes of Exercise per Session: Not on file  Stress:   . Feeling of Stress : Not on file  Social Connections:   . Frequency of Communication with Friends and Family: Not on file  . Frequency of Social Gatherings with Friends and Family: Not on file  . Attends Religious Services: Not on file  . Active Member of Clubs or Organizations: Not on file  . Attends Archivist Meetings: Not on file  . Marital Status: Not on file  Intimate Partner Violence:   . Fear of Current or Ex-Partner: Not on file  . Emotionally Abused: Not on file  . Physically Abused: Not on file  . Sexually Abused: Not on file    Outpatient Medications Prior  to Visit  Medication Sig Dispense Refill  . acetaminophen (TYLENOL) 500 MG tablet Take 500 mg by mouth every 6 (six) hours as needed.    Marland Kitchen amLODipine (NORVASC) 5 MG tablet TAKE ONE TABLET BY MOUTH DAILY 90 tablet 2  . FLUoxetine (PROZAC) 40 MG capsule Take 1 capsule (40 mg total) by mouth daily. 90 capsule 3  . Krill Oil Omega-3 500 MG CAPS Take 500 mg by mouth daily.    Marland Kitchen losartan-hydrochlorothiazide (HYZAAR) 100-25 MG tablet TAKE ONE TABLET BY MOUTH DAILY 90 tablet 0  . methocarbamol (ROBAXIN) 500 MG tablet Take 2 tablets (1,000 mg total) by mouth 4 (four) times daily. 30 tablet 0  . Multiple Vitamin (MULTIVITAMIN WITH MINERALS) TABS tablet Take 1 tablet by mouth daily. Centrum    . oxymetazoline (AFRIN) 0.05 % nasal spray Place 1 spray into both nostrils daily as needed for congestion.    . TURMERIC PO Take 1 tablet by mouth daily.    Marland Kitchen azithromycin (ZITHROMAX) 250 MG tablet Take 1 tablet (250 mg total) by mouth daily. Take first 2 tablets together, then 1 every day until finished. 6 tablet 0  . benzonatate (TESSALON PERLES) 100 MG capsule Take 1 capsule (100 mg total) by mouth 3 (three) times daily as needed. 20 capsule 0  . benzonatate (TESSALON) 100 MG capsule Take 1 capsule (100 mg total) by mouth 2 (two) times daily as needed for cough. 20 capsule 0   No facility-administered medications prior to visit.    No Known Allergies  Review of Systems  Constitutional: Positive for malaise/fatigue. Negative for chills and fever.  HENT: Positive for congestion, sinus pain and sore throat.   Eyes: Positive for discharge. Negative for blurred vision and pain.  Respiratory: Positive for cough and sputum production. Negative for shortness of breath.   Cardiovascular: Negative for chest pain, palpitations and leg swelling.  Gastrointestinal: Negative for abdominal pain, blood in stool and nausea.  Genitourinary: Negative for dysuria and frequency.  Musculoskeletal: Negative for falls and  myalgias.  Skin: Negative for rash.  Neurological: Negative for dizziness, loss of consciousness and headaches.  Endo/Heme/Allergies: Negative for environmental allergies.  Psychiatric/Behavioral: Negative for depression. The patient is not nervous/anxious.        Objective:    Physical Exam Constitutional:      Appearance: Normal appearance. She is not ill-appearing.  HENT:  Head: Normocephalic and atraumatic.     Right Ear: External ear normal.     Left Ear: External ear normal.     Nose: Nose normal.  Eyes:     General:        Right eye: No discharge.        Left eye: No discharge.  Pulmonary:     Effort: Pulmonary effort is normal.  Neurological:     Mental Status: She is alert and oriented to person, place, and time.  Psychiatric:        Behavior: Behavior normal.     There were no vitals taken for this visit. Wt Readings from Last 3 Encounters:  03/17/20 233 lb (105.7 kg)  02/29/20 235 lb (106.6 kg)  08/13/19 235 lb (106.6 kg)    Diabetic Foot Exam - Simple   No data filed     Lab Results  Component Value Date   WBC 8.7 02/29/2020   HGB 14.2 02/29/2020   HCT 42.2 02/29/2020   PLT 396 02/29/2020   GLUCOSE 108 (H) 02/29/2020   CHOL 207 (H) 08/13/2019   TRIG 142.0 08/13/2019   HDL 60.80 08/13/2019   LDLDIRECT 132.0 11/01/2016   LDLCALC 118 (H) 08/13/2019   ALT 42 02/29/2020   AST 31 02/29/2020   NA 138 02/29/2020   K 3.8 02/29/2020   CL 100 02/29/2020   CREATININE 0.79 02/29/2020   BUN 15 02/29/2020   CO2 26 02/29/2020   TSH 2.20 08/13/2019   HGBA1C 6.1 08/13/2019    Lab Results  Component Value Date   TSH 2.20 08/13/2019   Lab Results  Component Value Date   WBC 8.7 02/29/2020   HGB 14.2 02/29/2020   HCT 42.2 02/29/2020   MCV 95.9 02/29/2020   PLT 396 02/29/2020   Lab Results  Component Value Date   NA 138 02/29/2020   K 3.8 02/29/2020   CO2 26 02/29/2020   GLUCOSE 108 (H) 02/29/2020   BUN 15 02/29/2020   CREATININE 0.79  02/29/2020   BILITOT 0.6 02/29/2020   ALKPHOS 81 02/29/2020   AST 31 02/29/2020   ALT 42 02/29/2020   PROT 7.7 02/29/2020   ALBUMIN 4.2 02/29/2020   CALCIUM 9.5 02/29/2020   ANIONGAP 12 02/29/2020   GFR 74.53 08/13/2019   Lab Results  Component Value Date   CHOL 207 (H) 08/13/2019   Lab Results  Component Value Date   HDL 60.80 08/13/2019   Lab Results  Component Value Date   LDLCALC 118 (H) 08/13/2019   Lab Results  Component Value Date   TRIG 142.0 08/13/2019   Lab Results  Component Value Date   CHOLHDL 3 08/13/2019   Lab Results  Component Value Date   HGBA1C 6.1 08/13/2019       Assessment & Plan:   Problem List Items Addressed This Visit    Essential hypertension    Monitor and report any concerns. no changes to meds. Encouraged heart healthy diet such as the DASH diet and exercise as tolerated.       Conjunctivitis    Polytrim 2 drops tid and report if no improvement      Bronchitis    Patient has been sick for about 8 days. Her husband was sick before her ultimately tested negative for COVID and is responding to azithromycin and steroids. Will start patient on Zpak and Medrol dosepak she will seek further care and testing if symptoms do not improve or worsen.  I have discontinued Breindel B. Berka's benzonatate, azithromycin, and benzonatate. I am also having her start on methylPREDNISolone and trimethoprim-polymyxin b. Additionally, I am having her maintain her FLUoxetine, oxymetazoline, multivitamin with minerals, TURMERIC PO, Krill Oil Omega-3, methocarbamol, acetaminophen, amLODipine, and losartan-hydrochlorothiazide.  Meds ordered this encounter  Medications  . methylPREDNISolone (MEDROL) 4 MG tablet    Sig: 5 tab po qd X 1d then 4 tab po qd X 1d then 3 tab po qd X 1d then 2 tab po qd then 1 tab po qd    Dispense:  15 tablet    Refill:  0  . trimethoprim-polymyxin b (POLYTRIM) ophthalmic solution    Sig: Place 2 drops into both eyes 3  (three) times daily as needed.    Dispense:  10 mL    Refill:  0     I discussed the assessment and treatment plan with the patient. The patient was provided an opportunity to ask questions and all were answered. The patient agreed with the plan and demonstrated an understanding of the instructions.   The patient was advised to call back or seek an in-person evaluation if the symptoms worsen or if the condition fails to improve as anticipated.  I provided 20 minutes of non-face-to-face time during this encounter.   Penni Homans, MD

## 2020-09-21 NOTE — Assessment & Plan Note (Signed)
Polytrim 2 drops tid and report if no improvement

## 2020-09-21 NOTE — Patient Instructions (Addendum)
It was great to see you again today, I will be in touch your labs as soon as possible I ordered a bone density scan- you can stop by imaging on the way out today and possibly get this done now We will get you on the nurse schedule for a flu vaccine and shingles     Health Maintenance, Female Adopting a healthy lifestyle and getting preventive care are important in promoting health and wellness. Ask your health care provider about:  The right schedule for you to have regular tests and exams.  Things you can do on your own to prevent diseases and keep yourself healthy. What should I know about diet, weight, and exercise? Eat a healthy diet   Eat a diet that includes plenty of vegetables, fruits, low-fat dairy products, and lean protein.  Do not eat a lot of foods that are high in solid fats, added sugars, or sodium. Maintain a healthy weight Body mass index (BMI) is used to identify weight problems. It estimates body fat based on height and weight. Your health care provider can help determine your BMI and help you achieve or maintain a healthy weight. Get regular exercise Get regular exercise. This is one of the most important things you can do for your health. Most adults should:  Exercise for at least 150 minutes each week. The exercise should increase your heart rate and make you sweat (moderate-intensity exercise).  Do strengthening exercises at least twice a week. This is in addition to the moderate-intensity exercise.  Spend less time sitting. Even light physical activity can be beneficial. Watch cholesterol and blood lipids Have your blood tested for lipids and cholesterol at 64 years of age, then have this test every 5 years. Have your cholesterol levels checked more often if:  Your lipid or cholesterol levels are high.  You are older than 64 years of age.  You are at high risk for heart disease. What should I know about cancer screening? Depending on your health history  and family history, you may need to have cancer screening at various ages. This may include screening for:  Breast cancer.  Cervical cancer.  Colorectal cancer.  Skin cancer.  Lung cancer. What should I know about heart disease, diabetes, and high blood pressure? Blood pressure and heart disease  High blood pressure causes heart disease and increases the risk of stroke. This is more likely to develop in people who have high blood pressure readings, are of African descent, or are overweight.  Have your blood pressure checked: ? Every 3-5 years if you are 70-49 years of age. ? Every year if you are 82 years old or older. Diabetes Have regular diabetes screenings. This checks your fasting blood sugar level. Have the screening done:  Once every three years after age 23 if you are at a normal weight and have a low risk for diabetes.  More often and at a younger age if you are overweight or have a high risk for diabetes. What should I know about preventing infection? Hepatitis B If you have a higher risk for hepatitis B, you should be screened for this virus. Talk with your health care provider to find out if you are at risk for hepatitis B infection. Hepatitis C Testing is recommended for:  Everyone born from [birth year] through 1965.  Anyone with known risk factors for hepatitis C. Sexually transmitted infections (STIs)  Get screened for STIs, including gonorrhea and chlamydia, if: ? You are sexually active and are  younger than 64 years of age. ? You are older than 64 years of age and your health care provider tells you that you are at risk for this type of infection. ? Your sexual activity has changed since you were last screened, and you are at increased risk for chlamydia or gonorrhea. Ask your health care provider if you are at risk.  Ask your health care provider about whether you are at high risk for HIV. Your health care provider may recommend a prescription medicine to help  prevent HIV infection. If you choose to take medicine to prevent HIV, you should first get tested for HIV. You should then be tested every 3 months for as long as you are taking the medicine. Pregnancy  If you are about to stop having your period (premenopausal) and you may become pregnant, seek counseling before you get pregnant.  Take 400 to 800 micrograms (mcg) of folic acid every day if you become pregnant.  Ask for birth control (contraception) if you want to prevent pregnancy. Osteoporosis and menopause Osteoporosis is a disease in which the bones lose minerals and strength with aging. This can result in bone fractures. If you are 36 years old or older, or if you are at risk for osteoporosis and fractures, ask your health care provider if you should:  Be screened for bone loss.  Take a calcium or vitamin D supplement to lower your risk of fractures.  Be given hormone replacement therapy (HRT) to treat symptoms of menopause. Follow these instructions at home: Lifestyle  Do not use any products that contain nicotine or tobacco, such as cigarettes, e-cigarettes, and chewing tobacco. If you need help quitting, ask your health care provider.  Do not use street drugs.  Do not share needles.  Ask your health care provider for help if you need support or information about quitting drugs. Alcohol use  Do not drink alcohol if: ? Your health care provider tells you not to drink. ? You are pregnant, may be pregnant, or are planning to become pregnant.  If you drink alcohol: ? Limit how much you use to 0-1 drink a day. ? Limit intake if you are breastfeeding.  Be aware of how much alcohol is in your drink. In the [place] one drink equals one 12 oz bottle of beer (355 mL), one 5 oz glass of wine (148 mL), or one 1 oz glass of hard liquor (44 mL). General instructions  Schedule regular health, dental, and eye exams.  Stay current with your vaccines.  Tell your health care provider  if: ? You often feel depressed. ? You have ever been abused or do not feel safe at home. Summary  Adopting a healthy lifestyle and getting preventive care are important in promoting health and wellness.  Follow your health care provider's instructions about healthy diet, exercising, and getting tested or screened for diseases.  Follow your health care provider's instructions on monitoring your cholesterol and blood pressure. This information is not intended to replace advice given to you by your health care provider. Make sure you discuss any questions you have with your health care provider. Document Revised: 11/08/2018 Document Reviewed: 11/08/2018 Elsevier Patient Education  2020 [employer].

## 2020-09-21 NOTE — Assessment & Plan Note (Signed)
Patient has been sick for about 8 days. Her husband was sick before her ultimately tested negative for COVID and is responding to azithromycin and steroids. Will start patient on Zpak and Medrol dosepak she will seek further care and testing if symptoms do not improve or worsen.

## 2020-09-21 NOTE — Assessment & Plan Note (Signed)
Monitor and report any concerns. no changes to meds. Encouraged heart healthy diet such as the DASH diet and exercise as tolerated.  

## 2020-09-22 ENCOUNTER — Other Ambulatory Visit: Payer: Self-pay | Admitting: Family Medicine

## 2020-09-22 ENCOUNTER — Encounter: Payer: Self-pay | Admitting: Family Medicine

## 2020-09-22 DIAGNOSIS — F331 Major depressive disorder, recurrent, moderate: Secondary | ICD-10-CM

## 2020-09-23 ENCOUNTER — Encounter: Payer: Self-pay | Admitting: Family Medicine

## 2020-09-24 ENCOUNTER — Encounter: Payer: Self-pay | Admitting: Family Medicine

## 2020-09-24 ENCOUNTER — Ambulatory Visit (INDEPENDENT_AMBULATORY_CARE_PROVIDER_SITE_OTHER): Payer: BC Managed Care – PPO | Admitting: Family Medicine

## 2020-09-24 ENCOUNTER — Other Ambulatory Visit: Payer: Self-pay

## 2020-09-24 VITALS — BP 126/82 | HR 76 | Resp 18 | Ht 68.0 in | Wt 240.0 lb

## 2020-09-24 DIAGNOSIS — E785 Hyperlipidemia, unspecified: Secondary | ICD-10-CM | POA: Diagnosis not present

## 2020-09-24 DIAGNOSIS — R7303 Prediabetes: Secondary | ICD-10-CM | POA: Diagnosis not present

## 2020-09-24 DIAGNOSIS — I10 Essential (primary) hypertension: Secondary | ICD-10-CM

## 2020-09-24 DIAGNOSIS — Z0000 Encounter for general adult medical examination without abnormal findings: Secondary | ICD-10-CM | POA: Diagnosis not present

## 2020-09-24 DIAGNOSIS — F331 Major depressive disorder, recurrent, moderate: Secondary | ICD-10-CM

## 2020-09-24 DIAGNOSIS — E2839 Other primary ovarian failure: Secondary | ICD-10-CM

## 2020-09-24 DIAGNOSIS — R5383 Other fatigue: Secondary | ICD-10-CM

## 2020-09-24 MED ORDER — LOSARTAN POTASSIUM-HCTZ 100-25 MG PO TABS: 1.0000 | ORAL_TABLET | Freq: Every day | ORAL | 3 refills | Status: DC

## 2020-09-24 MED ORDER — FLUOXETINE HCL 40 MG PO CAPS: 40.0000 mg | ORAL_CAPSULE | Freq: Every day | ORAL | 3 refills | Status: DC

## 2020-09-25 ENCOUNTER — Encounter: Payer: Self-pay | Admitting: Family Medicine

## 2020-09-25 LAB — LIPID PANEL
Cholesterol: 244 mg/dL — ABNORMAL HIGH (ref <200)
HDL: 85 mg/dL (ref >=50)
LDL Cholesterol (Calc): 135 mg/dL (calc) — ABNORMAL HIGH
Non-HDL Cholesterol (Calc): 159 mg/dL (calc) — ABNORMAL HIGH (ref <130)
Total CHOL/HDL Ratio: 2.9 (calc) (ref <5.0)
Triglycerides: 128 mg/dL (ref <150)

## 2020-09-25 LAB — VITAMIN D 25 HYDROXY (VIT D DEFICIENCY, FRACTURES): Vit D, 25-Hydroxy: 23 ng/mL — ABNORMAL LOW (ref 30–100)

## 2020-09-25 LAB — BASIC METABOLIC PANEL
BUN: 16 mg/dL (ref 7–25)
CO2: 24 mmol/L (ref 20–32)
Calcium: 9.6 mg/dL (ref 8.6–10.4)
Chloride: 100 mmol/L (ref 98–110)
Creat: 0.84 mg/dL (ref 0.50–0.99)
Glucose, Bld: 112 mg/dL — ABNORMAL HIGH (ref 65–99)
Potassium: 3.8 mmol/L (ref 3.5–5.3)
Sodium: 138 mmol/L (ref 135–146)

## 2020-09-25 LAB — TSH: TSH: 1.55 mIU/L (ref 0.40–4.50)

## 2020-09-25 LAB — HEMOGLOBIN A1C
Hgb A1c MFr Bld: 6.3 % of total Hgb — ABNORMAL HIGH (ref <5.7)
Mean Plasma Glucose: 134 (calc)
eAG (mmol/L): 7.4 (calc)

## 2020-09-25 NOTE — Addendum Note (Signed)
Addended by: Lamar Blinks C on: 09/25/2020 08:21 AM   Modules accepted: Orders

## 2020-12-08 ENCOUNTER — Encounter: Payer: Self-pay | Admitting: Family Medicine

## 2020-12-08 DIAGNOSIS — D099 Carcinoma in situ, unspecified: Secondary | ICD-10-CM | POA: Insufficient documentation

## 2020-12-23 ENCOUNTER — Telehealth (INDEPENDENT_AMBULATORY_CARE_PROVIDER_SITE_OTHER): Payer: BC Managed Care – PPO | Admitting: Family Medicine

## 2020-12-23 ENCOUNTER — Encounter: Payer: Self-pay | Admitting: Family Medicine

## 2020-12-23 ENCOUNTER — Other Ambulatory Visit: Payer: Self-pay

## 2020-12-23 VITALS — BP 162/100 | HR 87 | Temp 97.6°F | Ht 68.0 in | Wt 245.0 lb

## 2020-12-23 DIAGNOSIS — M542 Cervicalgia: Secondary | ICD-10-CM | POA: Diagnosis not present

## 2020-12-23 DIAGNOSIS — R030 Elevated blood-pressure reading, without diagnosis of hypertension: Secondary | ICD-10-CM

## 2020-12-23 MED ORDER — MELOXICAM 15 MG PO TABS: 15.0000 mg | ORAL_TABLET | Freq: Every day | ORAL | 0 refills | Status: DC

## 2020-12-23 MED ORDER — CYCLOBENZAPRINE HCL 10 MG PO TABS: 5.0000 mg | ORAL_TABLET | Freq: Three times a day (TID) | ORAL | 0 refills | Status: DC | PRN

## 2020-12-23 MED ORDER — KETOROLAC TROMETHAMINE 60 MG/2ML IM SOLN
60.0000 mg | Freq: Once | INTRAMUSCULAR | Status: AC
  Administered 2020-12-23: 60 mg via INTRAMUSCULAR

## 2020-12-23 MED ORDER — OXYCODONE HCL 5 MG PO CAPS: 5.0000 mg | ORAL_CAPSULE | ORAL | 0 refills | Status: DC | PRN

## 2020-12-23 NOTE — Progress Notes (Signed)
Musculoskeletal Exam  Patient: Taylor Morales DOB: January 29, 1956  DOS: 12/23/2020  SUBJECTIVE:  Chief Complaint:   Chief Complaint  Patient presents with  . Neck Pain  . Headache  . Nausea    Taylor Morales is a 65 y.o.  female for evaluation and treatment of neck pain.   Onset:  2 days ago. No inj or change in activity.  Location: back of neck Character:  aching  Progression of issue:  has worsened Associated symptoms: Decreased ROM of neck, tension headache; no redness, bruising, swelling, fevers Treatment: to date has been acetaminophen, heat, opioids.   Neurovascular symptoms: no  Pt reports she does not get headache with her high BP and has been compliant with her meds. No CP or SOB.   Past Medical History:  Diagnosis Date  . Allergy    rhinitis  . Depression    stress related in past, on prozac-took self off  . Hyperlipidemia   . Hypertension     Objective: VITAL SIGNS: BP (!) 162/100 (BP Location: Left Arm, Patient Position: Sitting, Cuff Size: Normal)   Pulse 87   Temp 97.6 F (36.4 C) (Oral)   Ht 5\' 8"  (1.727 m)   Wt 245 lb (111.1 kg)   SpO2 98%   BMI 37.25 kg/m  Constitutional: Well formed, well developed. No acute distress. Thorax & Lungs: No accessory muscle use Musculoskeletal: neck.   Normal active range of motion: no.   Normal passive range of motion: no Tenderness to palpation: mild ttp over parasp cerv msc b/l, subocc triangle, lateral neck msc groups Deformity: no Ecchymosis: no Tests positive: none Tests negative: Spurling's Neurologic: Normal sensory function. No focal deficits noted. DTR's equal and symmetric in UE's. No clonus. 5/5 strength throughout UE's.  Psychiatric: Normal mood. Age appropriate judgment and insight. Alert & oriented x 3.    Assessment:  Neck pain - Plan: cyclobenzaprine (FLEXERIL) 10 MG tablet, meloxicam (MOBIC) 15 MG tablet, oxycodone (OXY-IR) 5 MG capsule  Elevated blood pressure reading  Plan: 1. Mobic starting  tomorrow. Toradol 60 mg today. Oxy for breakthrough pain. Don't take with Flexeril. Warnings about Flexeril and oxy verbalized and written down. Stretches/exercises, heat, ice, Tylenol.  2. Monitors BP at home. Is in pain today. No med changes today. F/u prn. The patient voiced understanding and agreement to the plan.   Girard, DO 12/23/20  3:23 PM

## 2020-12-23 NOTE — Patient Instructions (Addendum)
Ice/cold pack over area for 10-15 min twice daily.  Heat (pad or rice pillow in microwave) over affected area, 10-15 minutes twice daily.   OK to take Tylenol 1000 mg (2 extra strength tabs) or 975 mg (3 regular strength tabs) every 6 hours as needed.  No ibuprofen or NSAIDs while on the meloxicam.  Take Flexeril (cyclobenzaprine) 1-2 hours before planned bedtime. If it makes you drowsy, do not take during the day. You can try half a tab the following night.  Do not drink alcohol, do any illicit/street drugs, drive or do anything that requires alertness while on the oxycodone. Try not to take with the Flexeril.   Let us know if you need anything.  EXERCISES RANGE OF MOTION (ROM) AND STRETCHING EXERCISES  These exercises may help you when beginning to rehabilitate your issue. In order to successfully resolve your symptoms, you must improve your posture. These exercises are designed to help reduce the forward-head and rounded-shoulder posture which contributes to this condition. Your symptoms may resolve with or without further involvement from your physician, physical therapist or athletic trainer. While completing these exercises, remember:   Restoring tissue flexibility helps normal motion to return to the joints. This allows healthier, less painful movement and activity.  An effective stretch should be held for at least 20 seconds, although you may need to begin with shorter hold times for comfort.  A stretch should never be painful. You should only feel a gentle lengthening or release in the stretched tissue.  Do not do any stretch or exercise that you cannot tolerate.  STRETCH- Axial Extensors  Lie on your back on the floor. You may bend your knees for comfort. Place a rolled-up hand towel or dish towel, about 2 inches in diameter, under the part of your head that makes contact with the floor.  Gently tuck your chin, as if trying to make a "double chin," until you feel a gentle  stretch at the base of your head.  Hold 15-20 seconds. Repeat 2-3 times. Complete this exercise 1 time per day.   STRETCH - Axial Extension   Stand or sit on a firm surface. Assume a good posture: chest up, shoulders drawn back, abdominal muscles slightly tense, knees unlocked (if standing) and feet hip width apart.  Slowly retract your chin so your head slides back and your chin slightly lowers. Continue to look straight ahead.  You should feel a gentle stretch in the back of your head. Be certain not to feel an aggressive stretch since this can cause headaches later.  Hold for 15-20 seconds. Repeat 2-3 times. Complete this exercise 1 time per day.  STRETCH - Cervical Side Bend   Stand or sit on a firm surface. Assume a good posture: chest up, shoulders drawn back, abdominal muscles slightly tense, knees unlocked (if standing) and feet hip width apart.  Without letting your nose or shoulders move, slowly tip your right / left ear to your shoulder until your feel a gentle stretch in the muscles on the opposite side of your neck.  Hold 15-20 seconds. Repeat 2-3 times. Complete this exercise 1-2 times per day.  STRETCH - Cervical Rotators   Stand or sit on a firm surface. Assume a good posture: chest up, shoulders drawn back, abdominal muscles slightly tense, knees unlocked (if standing) and feet hip width apart.  Keeping your eyes level with the ground, slowly turn your head until you feel a gentle stretch along the back and opposite side of  your neck.  Hold 15-20 seconds. Repeat 2-3 times. Complete this exercise 1-2 times per day.  RANGE OF MOTION - Neck Circles   Stand or sit on a firm surface. Assume a good posture: chest up, shoulders drawn back, abdominal muscles slightly tense, knees unlocked (if standing) and feet hip width apart.  Gently roll your head down and around from the back of one shoulder to the back of the other. The motion should never be forced or  painful.  Repeat the motion 10-20 times, or until you feel the neck muscles relax and loosen. Repeat 2-3 times. Complete the exercise 1-2 times per day. STRENGTHENING EXERCISES - Cervical Strain and Sprain These exercises may help you when beginning to rehabilitate your injury. They may resolve your symptoms with or without further involvement from your physician, physical therapist, or athletic trainer. While completing these exercises, remember:   Muscles can gain both the endurance and the strength needed for everyday activities through controlled exercises.  Complete these exercises as instructed by your physician, physical therapist, or athletic trainer. Progress the resistance and repetitions only as guided.  You may experience muscle soreness or fatigue, but the pain or discomfort you are trying to eliminate should never worsen during these exercises. If this pain does worsen, stop and make certain you are following the directions exactly. If the pain is still present after adjustments, discontinue the exercise until you can discuss the trouble with your clinician.  STRENGTH - Cervical Flexors, Isometric  Face a wall, standing about 6 inches away. Place a small pillow, a ball about 6-8 inches in diameter, or a folded towel between your forehead and the wall.  Slightly tuck your chin and gently push your forehead into the soft object. Push only with mild to moderate intensity, building up tension gradually. Keep your jaw and forehead relaxed.  Hold 10 to 20 seconds. Keep your breathing relaxed.  Release the tension slowly. Relax your neck muscles completely before you start the next repetition. Repeat 2-3 times. Complete this exercise 1 time per day.  STRENGTH- Cervical Lateral Flexors, Isometric   Stand about 6 inches away from a wall. Place a small pillow, a ball about 6-8 inches in diameter, or a folded towel between the side of your head and the wall.  Slightly tuck your chin  and gently tilt your head into the soft object. Push only with mild to moderate intensity, building up tension gradually. Keep your jaw and forehead relaxed.  Hold 10 to 20 seconds. Keep your breathing relaxed.  Release the tension slowly. Relax your neck muscles completely before you start the next repetition. Repeat 2-3 times. Complete this exercise 1 time per day.  STRENGTH - Cervical Extensors, Isometric   Stand about 6 inches away from a wall. Place a small pillow, a ball about 6-8 inches in diameter, or a folded towel between the back of your head and the wall.  Slightly tuck your chin and gently tilt your head back into the soft object. Push only with mild to moderate intensity, building up tension gradually. Keep your jaw and forehead relaxed.  Hold 10 to 20 seconds. Keep your breathing relaxed.  Release the tension slowly. Relax your neck muscles completely before you start the next repetition. Repeat 2-3 times. Complete this exercise 1 time per day.  POSTURE AND BODY MECHANICS CONSIDERATIONS Keeping correct posture when sitting, standing or completing your activities will reduce the stress put on different body tissues, allowing injured tissues a chance to heal  and limiting painful experiences. The following are general guidelines for improved posture. Your physician or physical therapist will provide you with any instructions specific to your needs. While reading these guidelines, remember:  The exercises prescribed by your provider will help you have the flexibility and strength to maintain correct postures.  The correct posture provides the optimal environment for your joints to work. All of your joints have less wear and tear when properly supported by a spine with good posture. This means you will experience a healthier, less painful body.  Correct posture must be practiced with all of your activities, especially prolonged sitting and standing. Correct posture is as important  when doing repetitive low-stress activities (typing) as it is when doing a single heavy-load activity (lifting).  PROLONGED STANDING WHILE SLIGHTLY LEANING FORWARD When completing a task that requires you to lean forward while standing in one place for a long time, place either foot up on a stationary 2- to 4-inch high object to help maintain the best posture. When both feet are on the ground, the low back tends to lose its slight inward curve. If this curve flattens (or becomes too large), then the back and your other joints will experience too much stress, fatigue more quickly, and can cause pain.   RESTING POSITIONS Consider which positions are most painful for you when choosing a resting position. If you have pain with flexion-based activities (sitting, bending, stooping, squatting), choose a position that allows you to rest in a less flexed posture. You would want to avoid curling into a fetal position on your side. If your pain worsens with extension-based activities (prolonged standing, working overhead), avoid resting in an extended position such as sleeping on your stomach. Most people will find more comfort when they rest with their spine in a more neutral position, neither too rounded nor too arched. Lying on a non-sagging bed on your side with a pillow between your knees, or on your back with a pillow under your knees will often provide some relief. Keep in mind, being in any one position for a prolonged period of time, no matter how correct your posture, can still lead to stiffness.  WALKING Walk with an upright posture. Your ears, shoulders, and hips should all line up. OFFICE WORK When working at a desk, create an environment that supports good, upright posture. Without extra support, muscles fatigue and lead to excessive strain on joints and other tissues.  CHAIR:  A chair should be able to slide under your desk when your back makes contact with the back of the chair. This allows you  to work closely.  The chair's height should allow your eyes to be level with the upper part of your monitor and your hands to be slightly lower than your elbows.  Body position: ? Your feet should make contact with the floor. If this is not possible, use a foot rest. ? Keep your ears over your shoulders. This will reduce stress on your neck and low back.

## 2020-12-23 NOTE — Addendum Note (Signed)
Addended by: Sharon Seller B on: 12/23/2020 03:31 PM   Modules accepted: Orders

## 2021-01-06 ENCOUNTER — Encounter: Payer: Self-pay | Admitting: Family Medicine

## 2021-01-15 ENCOUNTER — Other Ambulatory Visit: Payer: Self-pay | Admitting: Family Medicine

## 2021-01-15 DIAGNOSIS — M542 Cervicalgia: Secondary | ICD-10-CM

## 2021-03-31 NOTE — Progress Notes (Addendum)
[facility] [place], [place], [place] [address] [phone]  Date:  04/02/2021   Name:  Taylor Morales   DOB:  1956-08-20   MRN:  [id number]  PCP:  Darreld Mclean, MD    Chief Complaint: Annual Exam   History of Present Illness:  Taylor Morales is a 65 y.o. very pleasant female patient who presents with the following:  Here today for routine physical Last seen by myself in October, also for physical History of prediabetes, obesity, hyperlipidemia, hypertension  She quit smoking in 1980s History of alcohol abuse, at her last visit she was doing better in this regard  COVID-19 booster- recommend 4th dose, she is considering  Pap is up-to-date Mammogram done in October Colon cancer screen up-to-date Shingles vaccine- she is thinking about doing later on   She was seen recently at urgent care with an ear infection.  This seems to have cleared up  She also underwent a turbinate reduction per ENT in November 2021- this worked well for her and she is really pleased with her results  She is traveling to [place] soon for vacation to visit family She injured her right small toe- stubbed on a stepstool a week ago.  She thought her toe might be broken.  Bruising has now spread across the distal third and fourth metatarsals as well.  She would like to be sure nothing is broken before their trip  She is taking prozac 40- she has some mild depression symptoms still, but she is being forced to retire a bit earlier than she had planned on which is an adjustment for her Overall she feels that her depression is under adequate control and does not wish to make any medication change  She is taking amlodipine 5 mg  Losartan/hctz Patient Active Problem List   Diagnosis Date Noted  . 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 Conjunctivitis 09/21/2020  . Bronchitis 09/21/2020  . Pre-diabetes 11/02/2016  . Alcoholism ([place])  04/17/2015  . Former smoker 04/17/2015  . Obesity 02/27/2010  . FOOT PAIN, RIGHT 05/16/2009  . Hyperlipidemia 09/29/2007  . Depression 09/29/2007  . Essential hypertension 09/29/2007  . Allergic rhinitis 09/29/2007    Past Medical History:  Diagnosis Date  . Allergy    rhinitis  . Depression    stress related in past, on prozac-took self off  . Hyperlipidemia   . Hypertension     Past Surgical History:  Procedure Laterality Date  . BREAST LUMPECTOMY     benign  . CESAREAN SECTION     x2  . NASAL SINUS SURGERY     x2  . TONSILLECTOMY     as child    Social History   Tobacco Use  . Smoking status: Former Smoker    Packs/day: 1.00    Years: 13.00    Pack years: 13.00    Types: Cigarettes    Quit date: 11/29/1982    Years since quitting: 38.3  . Smokeless tobacco: Current User  Vaping Use  . Vaping Use: Never used  Substance Use Topics  . Alcohol use: Yes    Alcohol/week: 0.0 standard drinks    Comment: bottle of wine a night  . Drug use: No    Family History  Problem Relation Age of Onset  . Lymphoma Mother        around 75  . Breast cancer Mother  age 67  . Pancreatic cancer Mother        ? pancreatic as had whipple  . Other Father        unknown  . Lymphoma Maternal Uncle     No Known Allergies  Medication list has been reviewed and updated.  Current Outpatient Medications on File Prior to Visit  Medication Sig Dispense Refill  . acetaminophen (TYLENOL) 500 MG tablet Take 500 mg by mouth every 6 (six) hours as needed.    Marland Kitchen amLODipine (NORVASC) 5 MG tablet TAKE ONE TABLET BY MOUTH DAILY 90 tablet 2  . cyclobenzaprine (FLEXERIL) 10 MG tablet Take 0.5-1 tablets (5-10 mg total) by mouth 3 (three) times daily as needed for muscle spasms. 21 tablet 0  . FLUoxetine (PROZAC) 40 MG capsule Take 1 capsule (40 mg total) by mouth daily. 90 capsule 3  . Krill Oil Omega-3 500 MG CAPS Take 500 mg by mouth daily.    Marland Kitchen losartan-hydrochlorothiazide  (HYZAAR) 100-25 MG tablet Take 1 tablet by mouth daily. 90 tablet 3  . meloxicam (MOBIC) 15 MG tablet Take 1 tablet (15 mg total) by mouth daily as needed for pain. 30 tablet 0  . Multiple Vitamin (MULTIVITAMIN WITH MINERALS) TABS tablet Take 1 tablet by mouth daily. Centrum    . oxycodone (OXY-IR) 5 MG capsule Take 1 capsule (5 mg total) by mouth every 4 (four) hours as needed. Please don't take with the Flexeril. 30 capsule 0  . oxymetazoline (AFRIN) 0.05 % nasal spray Place 1 spray into both nostrils daily as needed for congestion.    Marland Kitchen trimethoprim-polymyxin b (POLYTRIM) ophthalmic solution Place 2 drops into both eyes 3 (three) times daily as needed. 10 mL 0  . TURMERIC PO Take 1 tablet by mouth daily.    . [DISCONTINUED] fluticasone (FLONASE) 50 MCG/ACT nasal spray Place 2 sprays into both nostrils daily. (Patient not taking: Reported on 02/29/2020) 16 g 12  . [DISCONTINUED] fluticasone (VERAMYST) 27.5 MCG/SPRAY nasal spray Place 1 spray into the nose 2 (two) times daily. 30 g 3   No current facility-administered medications on file prior to visit.    Review of Systems:  As per HPI- otherwise negative.   Physical Examination: Vitals:   04/02/21 1054  BP: (!) 143/84  Pulse: 82  Temp: (!) 97.4 F (36.3 C)  SpO2: 98%   Vitals:   04/02/21 1054  Weight: 243 lb (110.2 kg)  Height: 5\' 8"  (1.727 m)   Body mass index is 36.95 kg/m. Ideal Body Weight: Weight in (lb) to have BMI = 25: 164.1  GEN: no acute distress.  Obese, looks well  HEENT: Atraumatic, Normocephalic.  Ears and Nose: No external deformity. CV: RRR, No M/G/R. No JVD. No thrill. No extra heart sounds. PULM: CTA B, no wheezes, crackles, rhonchi. No retractions. No resp. distress. No accessory muscle use. ABD: S, NT, ND, +BS. No rebound. No HSM. EXTR: No c/c/e PSYCH: Normally interactive. Conversant.  Right foot displays bruising and mild tenderness over the distal third and fourth metatarsals.  No swelling or  wound   Assessment and Plan: Physical exam  Pre-diabetes - Plan: Hemoglobin A1c  Essential hypertension, benign - Plan: CBC, Comprehensive metabolic panel  Hyperlipidemia, unspecified hyperlipidemia type  Injury of right foot, initial encounter - Plan: DG Foot Complete Right  Weight gain - Plan: TSH   Labs are pending as above.  Blood pressure under reasonable control Physical exam today.  Encouraged healthy diet and exercise routine.  Discussed health maintenance  She has noted some weight gain due to decreased physical activity during the pandemic.  Walking for exercise tends to cause sciatica pain.  We discussed some other options such as a exercise bicycle or water aerobics X-ray of foot is pending Will plan further follow- up pending labs. This visit occurred during the SARS-CoV-2 public health emergency.  Safety protocols were in place, including screening questions prior to the visit, additional usage of staff PPE, and extensive cleaning of exam room while observing appropriate contact time as indicated for disinfecting solutions.    Signed Lamar Blinks, MD Received x-ray report, sent patient a message about toe fracture DG Foot Complete Right  Result Date: 04/02/2021 CLINICAL DATA:  Injury. EXAM: RIGHT FOOT COMPLETE - 3+ VIEW COMPARISON:  No prior. FINDINGS: Slightly displaced fracture of the base of the proximal phalanx of the right fifth digit. No radiopaque foreign body. IMPRESSION: Slightly displaced fracture of the base of the proximal phalanx of the right fifth digit. Electronically Signed   By: Marcello Moores  Register   On: 04/02/2021 12:05   Addendum 5/5, received labs as below-message to patient Results for orders placed or performed in visit on 04/02/21  CBC  Result Value Ref Range   WBC 7.1 4.0 - 10.5 K/uL   RBC 4.46 3.87 - 5.11 Mil/uL   Platelets 365.0 150.0 - 400.0 K/uL   Hemoglobin 14.2 12.0 - 15.0 g/dL   HCT 41.8 36.0 - 46.0 %   MCV 93.7 78.0 - 100.0 fl   MCHC  34.0 30.0 - 36.0 g/dL   RDW 12.9 11.5 - 15.5 %  Comprehensive metabolic panel  Result Value Ref Range   Sodium 136 135 - 145 mEq/L   Potassium 4.1 3.5 - 5.1 mEq/L   Chloride 100 96 - 112 mEq/L   CO2 27 19 - 32 mEq/L   Glucose, Bld 122 (H) 70 - 99 mg/dL   BUN 16 6 - 23 mg/dL   Creatinine, Ser 0.88 0.40 - 1.20 mg/dL   Total Bilirubin 0.7 0.2 - 1.2 mg/dL   Alkaline Phosphatase 86 39 - 117 U/L   AST 18 0 - 37 U/L   ALT 20 0 - 35 U/L   Total Protein 7.4 6.0 - 8.3 g/dL   Albumin 4.5 3.5 - 5.2 g/dL   GFR 69.22 >60.00 mL/min   Calcium 9.9 8.4 - 10.5 mg/dL  Hemoglobin A1c  Result Value Ref Range   Hgb A1c MFr Bld 6.3 4.6 - 6.5 %  TSH  Result Value Ref Range   TSH 2.55 0.35 - 4.50 uIU/mL

## 2021-03-31 NOTE — Patient Instructions (Addendum)
It was great to see you again today, I will be in touch with the results of soon as possible Think about getting back into water aerobics- this may be a good option for you Go and get your foot x-ray after labs today    Health Maintenance After Age 65 After age 50, you are at a higher risk for certain long-term diseases and infections as well as injuries from falls. Falls are a major cause of broken bones and head injuries in people who are older than age 70. Getting regular preventive care can help to keep you healthy and well. Preventive care includes getting regular testing and making lifestyle changes as recommended by your health care provider. Talk with your health care provider about:  Which screenings and tests you should have. A screening is a test that checks for a disease when you have no symptoms.  A diet and exercise plan that is right for you. What should I know about screenings and tests to prevent falls? Screening and testing are the best ways to find a health problem early. Early diagnosis and treatment give you the best chance of managing medical conditions that are common after age 25. Certain conditions and lifestyle choices may make you more likely to have a fall. Your health care provider may recommend:  Regular vision checks. Poor vision and conditions such as cataracts can make you more likely to have a fall. If you wear glasses, make sure to get your prescription updated if your vision changes.  Medicine review. Work with your health care provider to regularly review all of the medicines you are taking, including over-the-counter medicines. Ask your health care provider about any side effects that may make you more likely to have a fall. Tell your health care provider if any medicines that you take make you feel dizzy or sleepy.  Osteoporosis screening. Osteoporosis is a condition that causes the bones to get weaker. This can make the bones weak and cause them to break more  easily.  Blood pressure screening. Blood pressure changes and medicines to control blood pressure can make you feel dizzy.  Strength and balance checks. Your health care provider may recommend certain tests to check your strength and balance while standing, walking, or changing positions.  Foot health exam. Foot pain and numbness, as well as not wearing proper footwear, can make you more likely to have a fall.  Depression screening. You may be more likely to have a fall if you have a fear of falling, feel emotionally low, or feel unable to do activities that you used to do.  Alcohol use screening. Using too much alcohol can affect your balance and may make you more likely to have a fall. What actions can I take to lower my risk of falls? General instructions  Talk with your health care provider about your risks for falling. Tell your health care provider if: ? You fall. Be sure to tell your health care provider about all falls, even ones that seem minor. ? You feel dizzy, sleepy, or off-balance.  Take over-the-counter and prescription medicines only as told by your health care provider. These include any supplements.  Eat a healthy diet and maintain a healthy weight. A healthy diet includes low-fat dairy products, low-fat (lean) meats, and fiber from whole grains, beans, and lots of fruits and vegetables. Home safety  Remove any tripping hazards, such as rugs, cords, and clutter.  Install safety equipment such as grab bars in bathrooms and safety  rails on stairs.  Keep rooms and walkways well-lit. Activity  Follow a regular exercise program to stay fit. This will help you maintain your balance. Ask your health care provider what types of exercise are appropriate for you.  If you need a cane or walker, use it as recommended by your health care provider.  Wear supportive shoes that have nonskid soles.   Lifestyle  Do not drink alcohol if your health care provider tells you not to  drink.  If you drink alcohol, limit how much you have: ? 0-1 drink a day for women. ? 0-2 drinks a day for men.  Be aware of how much alcohol is in your drink. In the [place] one drink equals one typical bottle of beer (12 oz), one-half glass of wine (5 oz), or one shot of hard liquor (1 oz).  Do not use any products that contain nicotine or tobacco, such as cigarettes and e-cigarettes. If you need help quitting, ask your health care provider. Summary  Having a healthy lifestyle and getting preventive care can help to protect your health and wellness after age 60.  Screening and testing are the best way to find a health problem early and help you avoid having a fall. Early diagnosis and treatment give you the best chance for managing medical conditions that are more common for people who are older than age 76.  Falls are a major cause of broken bones and head injuries in people who are older than age 13. Take precautions to prevent a fall at home.  Work with your health care provider to learn what changes you can make to improve your health and wellness and to prevent falls. This information is not intended to replace advice given to you by your health care provider. Make sure you discuss any questions you have with your health care provider. Document Revised: 03/08/2019 Document Reviewed: 09/28/2017 Elsevier Patient Education  2021 [employer].

## 2021-04-01 ENCOUNTER — Encounter: Payer: Self-pay | Admitting: Family Medicine

## 2021-04-02 ENCOUNTER — Other Ambulatory Visit: Payer: Self-pay

## 2021-04-02 ENCOUNTER — Encounter: Payer: Self-pay | Admitting: Family Medicine

## 2021-04-02 ENCOUNTER — Ambulatory Visit (HOSPITAL_BASED_OUTPATIENT_CLINIC_OR_DEPARTMENT_OTHER)
Admission: RE | Admit: 2021-04-02 | Discharge: 2021-04-02 | Disposition: A | Discharge status: Home / Self Care | Payer: BC Managed Care – PPO | Source: Ambulatory Visit | Attending: Family Medicine | Admitting: Family Medicine

## 2021-04-02 ENCOUNTER — Ambulatory Visit (INDEPENDENT_AMBULATORY_CARE_PROVIDER_SITE_OTHER): Payer: BC Managed Care – PPO | Admitting: Family Medicine

## 2021-04-02 VITALS — BP 143/84 | HR 82 | Temp 97.4°F | Ht 68.0 in | Wt 243.0 lb

## 2021-04-02 DIAGNOSIS — I10 Essential (primary) hypertension: Secondary | ICD-10-CM | POA: Diagnosis not present

## 2021-04-02 DIAGNOSIS — Z0000 Encounter for general adult medical examination without abnormal findings: Secondary | ICD-10-CM | POA: Diagnosis not present

## 2021-04-02 DIAGNOSIS — S99921A Unspecified injury of right foot, initial encounter: Secondary | ICD-10-CM

## 2021-04-02 DIAGNOSIS — R7303 Prediabetes: Secondary | ICD-10-CM

## 2021-04-02 DIAGNOSIS — E785 Hyperlipidemia, unspecified: Secondary | ICD-10-CM

## 2021-04-02 DIAGNOSIS — R635 Abnormal weight gain: Secondary | ICD-10-CM

## 2021-04-02 LAB — COMPREHENSIVE METABOLIC PANEL
ALT: 20 U/L (ref 0–35)
AST: 18 U/L (ref 0–37)
Albumin: 4.5 g/dL (ref 3.5–5.2)
Alkaline Phosphatase: 86 U/L (ref 39–117)
BUN: 16 mg/dL (ref 6–23)
CO2: 27 mEq/L (ref 19–32)
Calcium: 9.9 mg/dL (ref 8.4–10.5)
Chloride: 100 mEq/L (ref 96–112)
Creatinine, Ser: 0.88 mg/dL (ref 0.40–1.20)
GFR: 69.22 mL/min (ref >60.00)
Glucose, Bld: 122 mg/dL — ABNORMAL HIGH (ref 70–99)
Potassium: 4.1 mEq/L (ref 3.5–5.1)
Sodium: 136 mEq/L (ref 135–145)
Total Bilirubin: 0.7 mg/dL (ref 0.2–1.2)
Total Protein: 7.4 g/dL (ref 6.0–8.3)

## 2021-04-02 LAB — CBC
HCT: 41.8 % (ref 36.0–46.0)
Hemoglobin: 14.2 g/dL (ref 12.0–15.0)
MCHC: 34 g/dL (ref 30.0–36.0)
MCV: 93.7 fl (ref 78.0–100.0)
Platelets: 365 10*3/uL (ref 150.0–400.0)
RBC: 4.46 Mil/uL (ref 3.87–5.11)
RDW: 12.9 % (ref 11.5–15.5)
WBC: 7.1 10*3/uL (ref 4.0–10.5)

## 2021-04-02 LAB — HEMOGLOBIN A1C: Hgb A1c MFr Bld: 6.3 % (ref 4.6–6.5)

## 2021-04-02 LAB — TSH: TSH: 2.55 u[IU]/mL (ref 0.35–4.50)

## 2021-06-11 ENCOUNTER — Other Ambulatory Visit: Payer: Self-pay | Admitting: Family Medicine

## 2021-06-11 DIAGNOSIS — I10 Essential (primary) hypertension: Secondary | ICD-10-CM

## 2021-09-23 ENCOUNTER — Encounter: Payer: Self-pay | Admitting: Family Medicine

## 2021-09-23 DIAGNOSIS — Z1231 Encounter for screening mammogram for malignant neoplasm of breast: Secondary | ICD-10-CM | POA: Diagnosis not present

## 2021-09-23 LAB — HM MAMMOGRAPHY

## 2021-11-16 ENCOUNTER — Encounter (HOSPITAL_BASED_OUTPATIENT_CLINIC_OR_DEPARTMENT_OTHER): Payer: Self-pay

## 2021-11-16 ENCOUNTER — Emergency Department (HOSPITAL_BASED_OUTPATIENT_CLINIC_OR_DEPARTMENT_OTHER): Payer: Medicare HMO

## 2021-11-16 ENCOUNTER — Emergency Department (HOSPITAL_BASED_OUTPATIENT_CLINIC_OR_DEPARTMENT_OTHER)
Admission: EM | Admit: 2021-11-16 | Discharge: 2021-11-16 | Disposition: A | Discharge status: Home / Self Care | Payer: Medicare HMO | Attending: Emergency Medicine | Admitting: Emergency Medicine

## 2021-11-16 ENCOUNTER — Other Ambulatory Visit: Payer: Self-pay

## 2021-11-16 DIAGNOSIS — M25551 Pain in right hip: Secondary | ICD-10-CM | POA: Diagnosis not present

## 2021-11-16 DIAGNOSIS — M7918 Myalgia, other site: Secondary | ICD-10-CM

## 2021-11-16 DIAGNOSIS — M545 Low back pain, unspecified: Secondary | ICD-10-CM | POA: Diagnosis not present

## 2021-11-16 DIAGNOSIS — M533 Sacrococcygeal disorders, not elsewhere classified: Secondary | ICD-10-CM | POA: Insufficient documentation

## 2021-11-16 DIAGNOSIS — W19XXXA Unspecified fall, initial encounter: Secondary | ICD-10-CM | POA: Insufficient documentation

## 2021-11-16 DIAGNOSIS — Z87891 Personal history of nicotine dependence: Secondary | ICD-10-CM | POA: Diagnosis not present

## 2021-11-16 DIAGNOSIS — Z85828 Personal history of other malignant neoplasm of skin: Secondary | ICD-10-CM | POA: Diagnosis not present

## 2021-11-16 DIAGNOSIS — Z79899 Other long term (current) drug therapy: Secondary | ICD-10-CM | POA: Insufficient documentation

## 2021-11-16 DIAGNOSIS — I10 Essential (primary) hypertension: Secondary | ICD-10-CM | POA: Diagnosis not present

## 2021-11-16 MED ORDER — TRAMADOL HCL 50 MG PO TABS
50.0000 mg | ORAL_TABLET | Freq: Once | ORAL | Status: AC
  Administered 2021-11-16: 15:00:00 50 mg via ORAL
  Filled 2021-11-16: qty 1

## 2021-11-16 MED ORDER — TRAMADOL HCL 50 MG PO TABS: 50.0000 mg | ORAL_TABLET | Freq: Two times a day (BID) | ORAL | 0 refills | Status: DC | PRN

## 2021-11-16 NOTE — ED Provider Notes (Signed)
[facility]PART[facility] Provider Note   CSN: [id number] Arrival date & time: 11/16/21  1222     History Chief Complaint  Patient presents with   Taylor Morales is a 65 y.o. female.  HPI Patient is a 65 year old female with a medical history as noted below.  She presents to the emergency department due to pain to the right buttock.  Patient states that about 6 days ago she tripped and fell on her buttock.  She was having mild pain at the time but states that it worsened and has persisted for the past 6 days.  States that it particularly worsens when moving from a sitting to a standing position or moving from a standing position to a sitting position.  Also worsens when climbing and descending stairs.  She states that she has been taking Tylenol as well as tramadol intermittently with mild short-term relief.  No numbness or weakness.  No other complaints.    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     Family History  Problem Relation Age of Onset   Lymphoma Mother        around 74   Breast cancer Mother        age 67   Pancreatic cancer Mother        ? pancreatic as had whipple   Other Father         unknown   Lymphoma Maternal Uncle     Social History   Tobacco Use   Smoking status: Former    Packs/day: 1.00    Years: 13.00    Pack years: 13.00    Types: Cigarettes    Quit date: 11/29/1982    Years since quitting: 38.9   Smokeless tobacco: Current  Vaping Use   Vaping Use: Never used  Substance Use Topics   Alcohol use: Yes    Comment: daily   Drug use: No    Home Medications Prior to Admission medications   Medication Sig Start Date End Date Taking? Authorizing Provider  traMADol (ULTRAM) 50 MG tablet Take 1 tablet (50 mg total) by mouth every 12 (twelve) hours as needed. 11/16/21  Yes Rayna Sexton, PA-C  acetaminophen (TYLENOL) 500 MG tablet Take 500 mg by mouth every 6 (six) hours as needed.    [provider]  amLODipine (NORVASC) 5 MG tablet TAKE ONE TABLET BY MOUTH DAILY 06/11/21   Copland, Gay Filler, MD  cyclobenzaprine (FLEXERIL) 10 MG tablet Take 0.5-1 tablets (5-10 mg total) by mouth 3 (three) times daily as needed  for muscle spasms. 12/23/20   Shelda Pal, DO  FLUoxetine (PROZAC) 40 MG capsule Take 1 capsule (40 mg total) by mouth daily. 09/24/20   Copland, Gay Filler, MD  Krill Oil Omega-3 500 MG CAPS Take 500 mg by mouth daily.    [provider]  losartan-hydrochlorothiazide (HYZAAR) 100-25 MG tablet Take 1 tablet by mouth daily. 09/24/20   Copland, Gay Filler, MD  meloxicam (MOBIC) 15 MG tablet Take 1 tablet (15 mg total) by mouth daily as needed for pain. 01/15/21   Copland, Gay Filler, MD  Multiple Vitamin (MULTIVITAMIN WITH MINERALS) TABS tablet Take 1 tablet by mouth daily. Centrum    [provider]  oxymetazoline (AFRIN) 0.05 % nasal spray Place 1 spray into both nostrils daily as needed for congestion.    [provider]  trimethoprim-polymyxin b (POLYTRIM) ophthalmic solution Place 2 drops into both eyes 3 (three) times daily as needed. 09/19/20   Mosie Lukes, MD  TURMERIC PO Take 1 tablet by mouth daily.     [provider]  fluticasone (FLONASE) 50 MCG/ACT nasal spray Place 2 sprays into both nostrils daily. Patient not taking: Reported on 02/29/2020 08/31/18 02/29/20  Copland, Gay Filler, MD  fluticasone (VERAMYST) 27.5 MCG/SPRAY nasal spray Place 1 spray into the nose 2 (two) times daily. 08/26/11 02/18/12  Ricard Dillon, MD    Allergies    Patient has no known allergies.  Review of Systems   Review of Systems  Musculoskeletal:  Positive for arthralgias and myalgias.  Skin:  Negative for wound.  Neurological:  Negative for weakness and numbness.   Physical Exam Updated Vital Signs BP (!) 142/73 (BP Location: Left Arm)    Pulse 78    Temp 98.3 F (36.8 C) (Oral)    Resp 18    Ht 5\' 8"  (1.727 m)    Wt 109.8 kg    SpO2 97%    BMI 36.80 kg/m   Physical Exam Vitals and nursing note reviewed.  Constitutional:      General: She is not in acute distress.    Appearance: She is well-developed.  HENT:     Head: Normocephalic and atraumatic.     Right Ear: External ear normal.     Left Ear: External ear normal.  Eyes:     General: No scleral icterus.       Right eye: No discharge.        Left eye: No discharge.     Conjunctiva/sclera: Conjunctivae normal.  Neck:     Trachea: No tracheal deviation.  Cardiovascular:     Rate and Rhythm: Normal rate.  Pulmonary:     Effort: Pulmonary effort is normal. No respiratory distress.     Breath sounds: No stridor.  Abdominal:     General: There is no distension.  Musculoskeletal:        General: Tenderness present. No swelling or deformity.     Cervical back: Neck supple.     Comments: Moderate TTP noted along the musculature of the right buttock.  Full range of motion of the right hip, knee, and ankle.  Distal sensation intact.  2+ pedal pulses.  No midline spine pain.  Skin:    General: Skin is warm and dry.     Findings: No rash.  Neurological:     Mental Status: She is alert.     Cranial Nerves: Cranial nerve deficit: no gross  deficits.   ED Results / Procedures / Treatments  Labs (all labs ordered are listed, but only abnormal results are displayed) Labs Reviewed - No data to display  EKG None  Radiology DG Hip Unilat W or Wo Pelvis 2-3 Views Right  Result Date: 11/16/2021 CLINICAL DATA:  Right hip and buttock pain, fall 6 days ago EXAM: DG HIP (WITH OR WITHOUT PELVIS) 2-3V RIGHT COMPARISON:  CT pelvis 02/23/2020 FINDINGS: Acetabular spurring bilaterally with suspected mild subcortical cyst formation along the right upper acetabulum. Minimal spurring of the right femoral head. Preserved articular cartilage space. No fracture is identified. IMPRESSION: 1. No acute bony findings. If pain persists despite conservative therapy, MRI or CT of the pelvis may be warranted for further characterization. Electronically Signed   By: Van Clines M.D.   On: 11/16/2021 15:40    Procedures Procedures   Medications Ordered in ED Medications  traMADol (ULTRAM) tablet 50 mg (50 mg Oral Given 11/16/21 1518)    ED Course  I have reviewed the triage vital signs and the nursing notes.  Pertinent labs & imaging results that were available during my care of the patient were reviewed by me and considered in my medical decision making (see chart for details).    MDM Rules/Calculators/A&P                          Pt is a 65 y.o. female who presents to the emergency department persistent pain to the right buttock after falling 6 days ago.  Imaging: X-ray of the right hip and pelvis shows no acute bony findings.  If pain persists they recommend follow-up MRI or CT for further characterization.  I, Rayna Sexton, PA-C, personally reviewed and evaluated these images and lab results as part of my medical decision-making.  Patient with a mechanical fall 6 days ago.  States that she has had persistent pain in the right buttock.  On my exam patient has moderate tenderness in the region.  No tenderness along the lateral or  anterior right hip.  She has full range of motion of the right hip.  Given her age and persistent symptoms, I obtained x-rays which are negative.  Patient was given a dose of tramadol in the ED with moderate improvement in her symptoms.  Feel the patient is stable for discharge at this time.  She states that she is still able to bear weight and ambulate on the leg.  Will discharge on a short course of tramadol for breakthrough symptoms.  We discussed return precautions.  Recommended patient return to the ED in 1 week for follow-up imaging if she finds that her symptoms have not improved.  Her questions were answered and she was amicable at the time of discharge.  Note: Portions of this report may have been transcribed using voice recognition software. Every effort was made to ensure accuracy; however, inadvertent computerized transcription errors may be present.   Final Clinical Impression(s) / ED Diagnoses Final diagnoses:  Right buttock pain   Rx / DC Orders ED Discharge Orders          Ordered    traMADol (ULTRAM) 50 MG tablet  Every 12 hours PRN        11/16/21 1624             Rayna Sexton, PA-C 11/16/21 1629    Drenda Freeze, MD 11/16/21 1630

## 2021-11-16 NOTE — Discharge Instructions (Addendum)
I have prescribed you a strong narcotic called Tramadol. Please only take this as prescribed. Do not drive or operate heavy machinery after taking this medication. Do not mix it with alcohol.   If you find your symptoms have not improved in 1 week please come back to the emergency department for a follow-up CT scan.  If you develop any new or worsening symptoms please come back sooner.  It was a pleasure to meet you.

## 2021-11-16 NOTE — ED Triage Notes (Signed)
Pt states she fell 12/15-pain to right buttock-NAD-slow gait

## 2021-11-17 ENCOUNTER — Other Ambulatory Visit: Payer: Self-pay

## 2021-11-17 DIAGNOSIS — F331 Major depressive disorder, recurrent, moderate: Secondary | ICD-10-CM

## 2021-11-17 DIAGNOSIS — I10 Essential (primary) hypertension: Secondary | ICD-10-CM

## 2021-11-17 MED ORDER — FLUOXETINE HCL 40 MG PO CAPS: 40.0000 mg | ORAL_CAPSULE | Freq: Every day | ORAL | 1 refills | Status: DC

## 2021-11-17 MED ORDER — LOSARTAN POTASSIUM-HCTZ 100-25 MG PO TABS: 1.0000 | ORAL_TABLET | Freq: Every day | ORAL | 1 refills | Status: DC

## 2021-11-25 ENCOUNTER — Encounter: Payer: Self-pay | Admitting: Family Medicine

## 2021-11-25 DIAGNOSIS — B009 Herpesviral infection, unspecified: Secondary | ICD-10-CM

## 2021-11-25 MED ORDER — VALACYCLOVIR HCL 500 MG PO TABS: 500.0000 mg | ORAL_TABLET | Freq: Two times a day (BID) | ORAL | 3 refills | Status: DC
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
## 2022-02-19 ENCOUNTER — Telehealth: Payer: Self-pay | Admitting: Family Medicine

## 2022-02-19 NOTE — Telephone Encounter (Signed)
Left message for patient to call back and schedule Medicare Annual Wellness Visit (AWV) in office.  ? ?If not able to come in office, please offer to do virtually or by telephone.  Left office number and my jabber [phone]. ? ?Last AWV:11/01/2016 ? ?Please schedule at anytime with Nurse Health Advisor. ?  ?

## 2022-04-16 ENCOUNTER — Other Ambulatory Visit: Payer: Self-pay | Admitting: Family Medicine

## 2022-04-16 DIAGNOSIS — I10 Essential (primary) hypertension: Secondary | ICD-10-CM

## 2022-07-31 ENCOUNTER — Other Ambulatory Visit: Payer: Self-pay | Admitting: Family Medicine

## 2022-07-31 DIAGNOSIS — F331 Major depressive disorder, recurrent, moderate: Secondary | ICD-10-CM

## 2022-07-31 DIAGNOSIS — I10 Essential (primary) hypertension: Secondary | ICD-10-CM

## 2022-08-05 DIAGNOSIS — H35033 Hypertensive retinopathy, bilateral: Secondary | ICD-10-CM | POA: Diagnosis not present

## 2022-08-05 DIAGNOSIS — H5203 Hypermetropia, bilateral: Secondary | ICD-10-CM | POA: Diagnosis not present

## 2022-08-14 NOTE — Progress Notes (Unsigned)
[facility] [address], [place], [place] [address] [phone]  Date:  08/18/2022   Name:  Taylor Morales   DOB:  Sep 25, 1956   MRN:  [id number]  PCP:  Darreld Mclean, MD    Chief Complaint: No chief complaint on file.   History of Present Illness:  Taylor Morales is a 66 y.o. 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   Conjunctivitis 09/21/2020   Bronchitis 09/21/2020   Pre-diabetes 11/02/2016   Alcoholism ([place]) 04/17/2015   Former smoker 04/17/2015   Obesity 02/27/2010   FOOT PAIN, RIGHT 05/16/2009   Hyperlipidemia 09/29/2007   Depression 09/29/2007   Essential hypertension 09/29/2007   Allergic rhinitis 09/29/2007    Past Medical History:  Diagnosis Date   Allergy    rhinitis   Depression    stress related in past, on prozac-took self off   Hyperlipidemia    Hypertension     Past Surgical History:  Procedure Laterality Date   BREAST LUMPECTOMY     benign   CESAREAN SECTION     x2   NASAL SINUS SURGERY     x2   TONSILLECTOMY     as child    Social History   Tobacco Use   Smoking status: Former    Packs/day: 1.00    Years: 13.00    Total pack years: 13.00    Types: Cigarettes    Quit date: 11/29/1982    Years since quitting: 39.7   Smokeless tobacco: Current  Vaping Use   Vaping Use: Never used  Substance Use Topics   Alcohol use: Yes    Comment: daily   Drug use: No    Family History  Problem Relation Age of Onset   Lymphoma Mother        around 73    Breast cancer Mother        age 58   Pancreatic cancer Mother        ? pancreatic as had whipple   Other Father        unknown   Lymphoma Maternal Uncle     No Known Allergies  Medication list has been reviewed and updated.  Current Outpatient Medications on File Prior to Visit  Medication Sig Dispense Refill   acetaminophen (TYLENOL) 500 MG tablet Take 500 mg by mouth every 6 (six) hours as needed.     amLODipine (NORVASC) 5 MG tablet Take 1 tablet (5 mg total) by mouth daily. 90 tablet 1   cyclobenzaprine (FLEXERIL) 10 MG tablet Take 0.5-1 tablets (5-10 mg total) by mouth 3 (three) times daily as needed for muscle spasms. 21 tablet 0   FLUoxetine (PROZAC) 40 MG capsule Take 1 capsule (40 mg total) by mouth daily. 90 capsule 1   Krill Oil Omega-3 500 MG CAPS Take 500 mg by mouth daily.     losartan-hydrochlorothiazide (HYZAAR) 100-25 MG tablet Take 1 tablet by mouth daily. 90 tablet 1   meloxicam (MOBIC) 15 MG tablet Take 1 tablet (15 mg total) by mouth daily as  needed for pain. 30 tablet 0   Multiple Vitamin (MULTIVITAMIN WITH MINERALS) TABS tablet Take 1 tablet by mouth daily. Centrum     oxymetazoline (AFRIN) 0.05 % nasal spray Place 1 spray into both nostrils daily as needed for congestion.     traMADol (ULTRAM) 50 MG tablet Take 1 tablet (50 mg total) by mouth every 12 (twelve) hours as needed. 10 tablet 0   trimethoprim-polymyxin b (POLYTRIM) ophthalmic solution Place 2 drops into both eyes 3 (three) times daily as needed. 10 mL 0   TURMERIC PO Take 1 tablet by mouth daily.     valACYclovir (VALTREX) 500 MG tablet Take 1 tablet (500 mg total) by mouth 2 (two) times daily. Take twice a day for 3 days as needed for outbreak 30 tablet 3   [DISCONTINUED] fluticasone (FLONASE) 50 MCG/ACT nasal spray Place 2 sprays into both nostrils daily. (Patient not taking: Reported on 02/29/2020) 16 g 12   [DISCONTINUED] fluticasone (VERAMYST) 27.5 MCG/SPRAY nasal spray Place 1 spray into the nose 2  (two) times daily. 30 g 3   No current facility-administered medications on file prior to visit.    Review of Systems:  As per HPI- otherwise negative.   Physical Examination: There were no vitals filed for this visit. There were no vitals filed for this visit. There is no height or weight on file to calculate BMI. Ideal Body Weight:    GEN: no acute distress. HEENT: Atraumatic, Normocephalic.  Ears and Nose: No external deformity. CV: RRR, No M/G/R. No JVD. No thrill. No extra heart sounds. PULM: CTA B, no wheezes, crackles, rhonchi. No retractions. No resp. distress. No accessory muscle use. ABD: S, NT, ND, +BS. No rebound. No HSM. EXTR: No c/c/e PSYCH: Normally interactive. Conversant.    Assessment and Plan: ***  Signed Lamar Blinks, MD

## 2022-08-14 NOTE — Patient Instructions (Incomplete)
It was good to see you today- I will be in touch with your labs asap!  For weight loss- let's see if we can get [facility] for you or something similar.  I sent in an rx- please let me know if your insurance will cover this.   Start with 0.25 for one month, then 0.5- we can go up from there as needed After labs please stop by the ground floor and schedule (or do!) your left leg ultrasound to rule out a clot  Plan for pneumonia vaccine at your convenience, and 2nd shingles/ covid booster this fall   Please give my warmest congrats to Kit and their new wife!  ?light box for psoriasis

## 2022-08-18 ENCOUNTER — Ambulatory Visit (INDEPENDENT_AMBULATORY_CARE_PROVIDER_SITE_OTHER): Payer: Medicare HMO | Admitting: Family Medicine

## 2022-08-18 ENCOUNTER — Encounter: Payer: Self-pay | Admitting: Family Medicine

## 2022-08-18 ENCOUNTER — Telehealth (HOSPITAL_BASED_OUTPATIENT_CLINIC_OR_DEPARTMENT_OTHER): Payer: Self-pay

## 2022-08-18 VITALS — BP 110/78 | HR 77 | Temp 97.7°F | Resp 18 | Ht 68.0 in | Wt 249.6 lb

## 2022-08-18 DIAGNOSIS — Z0000 Encounter for general adult medical examination without abnormal findings: Secondary | ICD-10-CM | POA: Diagnosis not present

## 2022-08-18 DIAGNOSIS — Z6837 Body mass index (BMI) 37.0-37.9, adult: Secondary | ICD-10-CM

## 2022-08-18 DIAGNOSIS — R5383 Other fatigue: Secondary | ICD-10-CM | POA: Diagnosis not present

## 2022-08-18 DIAGNOSIS — R7303 Prediabetes: Secondary | ICD-10-CM | POA: Diagnosis not present

## 2022-08-18 DIAGNOSIS — I10 Essential (primary) hypertension: Secondary | ICD-10-CM

## 2022-08-18 DIAGNOSIS — R2242 Localized swelling, mass and lump, left lower limb: Secondary | ICD-10-CM | POA: Diagnosis not present

## 2022-08-18 DIAGNOSIS — E785 Hyperlipidemia, unspecified: Secondary | ICD-10-CM | POA: Diagnosis not present

## 2022-08-18 LAB — LIPID PANEL
Cholesterol: 224 mg/dL — ABNORMAL HIGH (ref 0–200)
HDL: 71.8 mg/dL (ref >39.00)
LDL Cholesterol: 135 mg/dL — ABNORMAL HIGH (ref 0–99)
NonHDL: 152.14
Total CHOL/HDL Ratio: 3
Triglycerides: 84 mg/dL (ref 0.0–149.0)
VLDL: 16.8 mg/dL (ref 0.0–40.0)

## 2022-08-18 LAB — CBC
HCT: 37.2 % (ref 36.0–46.0)
Hemoglobin: 12.3 g/dL (ref 12.0–15.0)
MCHC: 33 g/dL (ref 30.0–36.0)
MCV: 90.8 fl (ref 78.0–100.0)
Platelets: 367 10*3/uL (ref 150.0–400.0)
RBC: 4.09 Mil/uL (ref 3.87–5.11)
RDW: 12.2 % (ref 11.5–15.5)
WBC: 6.8 10*3/uL (ref 4.0–10.5)

## 2022-08-18 LAB — COMPREHENSIVE METABOLIC PANEL
ALT: 16 U/L (ref 0–35)
AST: 16 U/L (ref 0–37)
Albumin: 4.1 g/dL (ref 3.5–5.2)
Alkaline Phosphatase: 85 U/L (ref 39–117)
BUN: 14 mg/dL (ref 6–23)
CO2: 27 mEq/L (ref 19–32)
Calcium: 9.5 mg/dL (ref 8.4–10.5)
Chloride: 98 mEq/L (ref 96–112)
Creatinine, Ser: 0.86 mg/dL (ref 0.40–1.20)
GFR: 70.47 mL/min (ref >60.00)
Glucose, Bld: 105 mg/dL — ABNORMAL HIGH (ref 70–99)
Potassium: 4.2 mEq/L (ref 3.5–5.1)
Sodium: 134 mEq/L — ABNORMAL LOW (ref 135–145)
Total Bilirubin: 0.4 mg/dL (ref 0.2–1.2)
Total Protein: 6.8 g/dL (ref 6.0–8.3)

## 2022-08-18 LAB — VITAMIN D 25 HYDROXY (VIT D DEFICIENCY, FRACTURES): VITD: 29.55 ng/mL — ABNORMAL LOW (ref 30.00–100.00)

## 2022-08-18 LAB — TSH: TSH: 2.89 u[IU]/mL (ref 0.35–5.50)

## 2022-08-18 LAB — HEMOGLOBIN A1C: Hgb A1c MFr Bld: 6.4 % (ref 4.6–6.5)

## 2022-08-18 MED ORDER — WEGOVY 0.25 MG/0.5ML ~~LOC~~ SOAJ: SUBCUTANEOUS | 2 refills | Status: DC

## 2022-08-19 NOTE — Addendum Note (Signed)
Addended by: Lamar Blinks C on: 08/19/2022 06:26 PM   Modules accepted: Orders

## 2022-08-20 ENCOUNTER — Encounter: Payer: Self-pay | Admitting: Family Medicine

## 2022-08-20 ENCOUNTER — Ambulatory Visit
Admission: RE | Admit: 2022-08-20 | Discharge: 2022-08-20 | Disposition: A | Discharge status: Home / Self Care | Payer: Medicare HMO | Source: Ambulatory Visit | Attending: Family Medicine | Admitting: Family Medicine

## 2022-08-20 DIAGNOSIS — R2242 Localized swelling, mass and lump, left lower limb: Secondary | ICD-10-CM

## 2022-08-20 DIAGNOSIS — R600 Localized edema: Secondary | ICD-10-CM | POA: Diagnosis not present

## 2022-08-23 DIAGNOSIS — H2511 Age-related nuclear cataract, right eye: Secondary | ICD-10-CM | POA: Diagnosis not present

## 2022-08-23 DIAGNOSIS — H2513 Age-related nuclear cataract, bilateral: Secondary | ICD-10-CM | POA: Diagnosis not present

## 2022-08-23 DIAGNOSIS — H40013 Open angle with borderline findings, low risk, bilateral: Secondary | ICD-10-CM | POA: Diagnosis not present

## 2022-09-07 DIAGNOSIS — L820 Inflamed seborrheic keratosis: Secondary | ICD-10-CM | POA: Diagnosis not present

## 2022-09-07 DIAGNOSIS — L814 Other melanin hyperpigmentation: Secondary | ICD-10-CM | POA: Diagnosis not present

## 2022-09-07 DIAGNOSIS — L821 Other seborrheic keratosis: Secondary | ICD-10-CM | POA: Diagnosis not present

## 2022-09-07 DIAGNOSIS — D485 Neoplasm of uncertain behavior of skin: Secondary | ICD-10-CM | POA: Diagnosis not present

## 2022-09-07 DIAGNOSIS — L300 Nummular dermatitis: Secondary | ICD-10-CM | POA: Diagnosis not present

## 2022-09-07 DIAGNOSIS — L570 Actinic keratosis: Secondary | ICD-10-CM | POA: Diagnosis not present

## 2022-09-07 DIAGNOSIS — L304 Erythema intertrigo: Secondary | ICD-10-CM | POA: Diagnosis not present

## 2022-09-07 DIAGNOSIS — D225 Melanocytic nevi of trunk: Secondary | ICD-10-CM | POA: Diagnosis not present

## 2022-09-07 DIAGNOSIS — Z85828 Personal history of other malignant neoplasm of skin: Secondary | ICD-10-CM | POA: Diagnosis not present

## 2022-09-12 ENCOUNTER — Encounter: Payer: Self-pay | Admitting: Family Medicine

## 2022-09-12 DIAGNOSIS — E2839 Other primary ovarian failure: Secondary | ICD-10-CM

## 2022-09-23 ENCOUNTER — Telehealth: Payer: Self-pay

## 2022-09-23 NOTE — Patient Outreach (Signed)
  Care Coordination   09/23/2022 Name: Taylor Morales MRN: [id number] DOB: 1956/01/01   Care Coordination Outreach Attempts:  An unsuccessful telephone outreach was attempted today to offer the patient information about available care coordination services as a benefit of their health plan.   Follow Up Plan:  Additional outreach attempts will be made to offer the patient care coordination information and services.   Encounter Outcome:  No Answer  Care Coordination Interventions Activated:  No   Care Coordination Interventions:  No, not indicated    Thea Silversmith, RN, MSN, BSN, [facility] Coordinator [phone]

## 2022-10-11 ENCOUNTER — Telehealth: Payer: Self-pay | Admitting: *Deleted

## 2022-10-11 NOTE — Telephone Encounter (Signed)
LMOM for pt to return call to schedule AWV

## 2022-10-17 ENCOUNTER — Encounter (INDEPENDENT_AMBULATORY_CARE_PROVIDER_SITE_OTHER): Payer: Medicare HMO | Admitting: Family Medicine

## 2022-10-17 DIAGNOSIS — J40 Bronchitis, not specified as acute or chronic: Secondary | ICD-10-CM

## 2022-10-17 NOTE — Telephone Encounter (Signed)
Please see the MyChart message reply(ies) for my assessment and plan.  The patient gave consent for this Medical Advice Message and is aware that it may result in a bill to their insurance company as well as the possibility that this may result in a co-payment or deductible. They are an established patient, but are not seeking medical advice exclusively about a problem treated during an in person or video visit in the last 7 days. I did not recommend an in person or video visit within 7 days of my reply.  I spent a total of 10 minutes cumulative time within 7 days through MyChart messaging Tahje Borawski, MD  

## 2022-10-18 MED ORDER — AZITHROMYCIN 250 MG PO TABS: ORAL_TABLET | ORAL | 0 refills | Status: DC

## 2022-11-02 DIAGNOSIS — M85851 Other specified disorders of bone density and structure, right thigh: Secondary | ICD-10-CM | POA: Diagnosis not present

## 2022-11-02 DIAGNOSIS — M85852 Other specified disorders of bone density and structure, left thigh: Secondary | ICD-10-CM | POA: Diagnosis not present

## 2022-11-02 DIAGNOSIS — Z1231 Encounter for screening mammogram for malignant neoplasm of breast: Secondary | ICD-10-CM | POA: Diagnosis not present

## 2022-11-02 DIAGNOSIS — Z780 Asymptomatic menopausal state: Secondary | ICD-10-CM | POA: Diagnosis not present

## 2022-11-03 ENCOUNTER — Encounter: Payer: Self-pay | Admitting: Family Medicine

## 2022-11-04 DIAGNOSIS — H2511 Age-related nuclear cataract, right eye: Secondary | ICD-10-CM | POA: Diagnosis not present

## 2022-11-05 DIAGNOSIS — H2512 Age-related nuclear cataract, left eye: Secondary | ICD-10-CM | POA: Diagnosis not present

## 2022-11-05 DIAGNOSIS — H25012 Cortical age-related cataract, left eye: Secondary | ICD-10-CM | POA: Diagnosis not present

## 2022-11-05 DIAGNOSIS — H25042 Posterior subcapsular polar age-related cataract, left eye: Secondary | ICD-10-CM | POA: Diagnosis not present

## 2022-11-08 DIAGNOSIS — C44729 Squamous cell carcinoma of skin of left lower limb, including hip: Secondary | ICD-10-CM | POA: Diagnosis not present

## 2022-11-08 DIAGNOSIS — Z85828 Personal history of other malignant neoplasm of skin: Secondary | ICD-10-CM | POA: Diagnosis not present

## 2022-11-25 DIAGNOSIS — H2512 Age-related nuclear cataract, left eye: Secondary | ICD-10-CM | POA: Diagnosis not present

## 2023-01-10 DIAGNOSIS — L570 Actinic keratosis: Secondary | ICD-10-CM | POA: Diagnosis not present

## 2023-01-10 DIAGNOSIS — Z85828 Personal history of other malignant neoplasm of skin: Secondary | ICD-10-CM | POA: Diagnosis not present

## 2023-01-10 DIAGNOSIS — L720 Epidermal cyst: Secondary | ICD-10-CM | POA: Diagnosis not present

## 2023-01-10 DIAGNOSIS — L820 Inflamed seborrheic keratosis: Secondary | ICD-10-CM | POA: Diagnosis not present

## 2023-01-10 DIAGNOSIS — L814 Other melanin hyperpigmentation: Secondary | ICD-10-CM | POA: Diagnosis not present

## 2023-02-02 DIAGNOSIS — J0100 Acute maxillary sinusitis, unspecified: Secondary | ICD-10-CM | POA: Diagnosis not present

## 2023-02-07 ENCOUNTER — Other Ambulatory Visit: Payer: Self-pay | Admitting: Family Medicine

## 2023-02-07 DIAGNOSIS — F331 Major depressive disorder, recurrent, moderate: Secondary | ICD-10-CM

## 2023-02-07 DIAGNOSIS — I10 Essential (primary) hypertension: Secondary | ICD-10-CM

## 2023-05-12 ENCOUNTER — Other Ambulatory Visit: Payer: Self-pay | Admitting: Family Medicine

## 2023-05-12 DIAGNOSIS — F331 Major depressive disorder, recurrent, moderate: Secondary | ICD-10-CM

## 2023-05-20 ENCOUNTER — Other Ambulatory Visit: Payer: Self-pay | Admitting: Family Medicine

## 2023-05-20 DIAGNOSIS — I10 Essential (primary) hypertension: Secondary | ICD-10-CM

## 2023-08-02 ENCOUNTER — Other Ambulatory Visit: Payer: Self-pay | Admitting: Family Medicine

## 2023-08-02 DIAGNOSIS — I10 Essential (primary) hypertension: Secondary | ICD-10-CM

## 2023-08-02 DIAGNOSIS — F331 Major depressive disorder, recurrent, moderate: Secondary | ICD-10-CM

## 2023-09-07 IMAGING — DX DG HIP (WITH OR WITHOUT PELVIS) 2-3V*R*
3 series · 3 of 3 positions shown · non-contrast
Comparison: CT pelvis 02/23/2020

CLINICAL DATA: Right hip and buttock pain, fall 6 days ago

EXAM:
DG HIP (WITH OR WITHOUT PELVIS) 2-3V RIGHT

[pelvis ap]
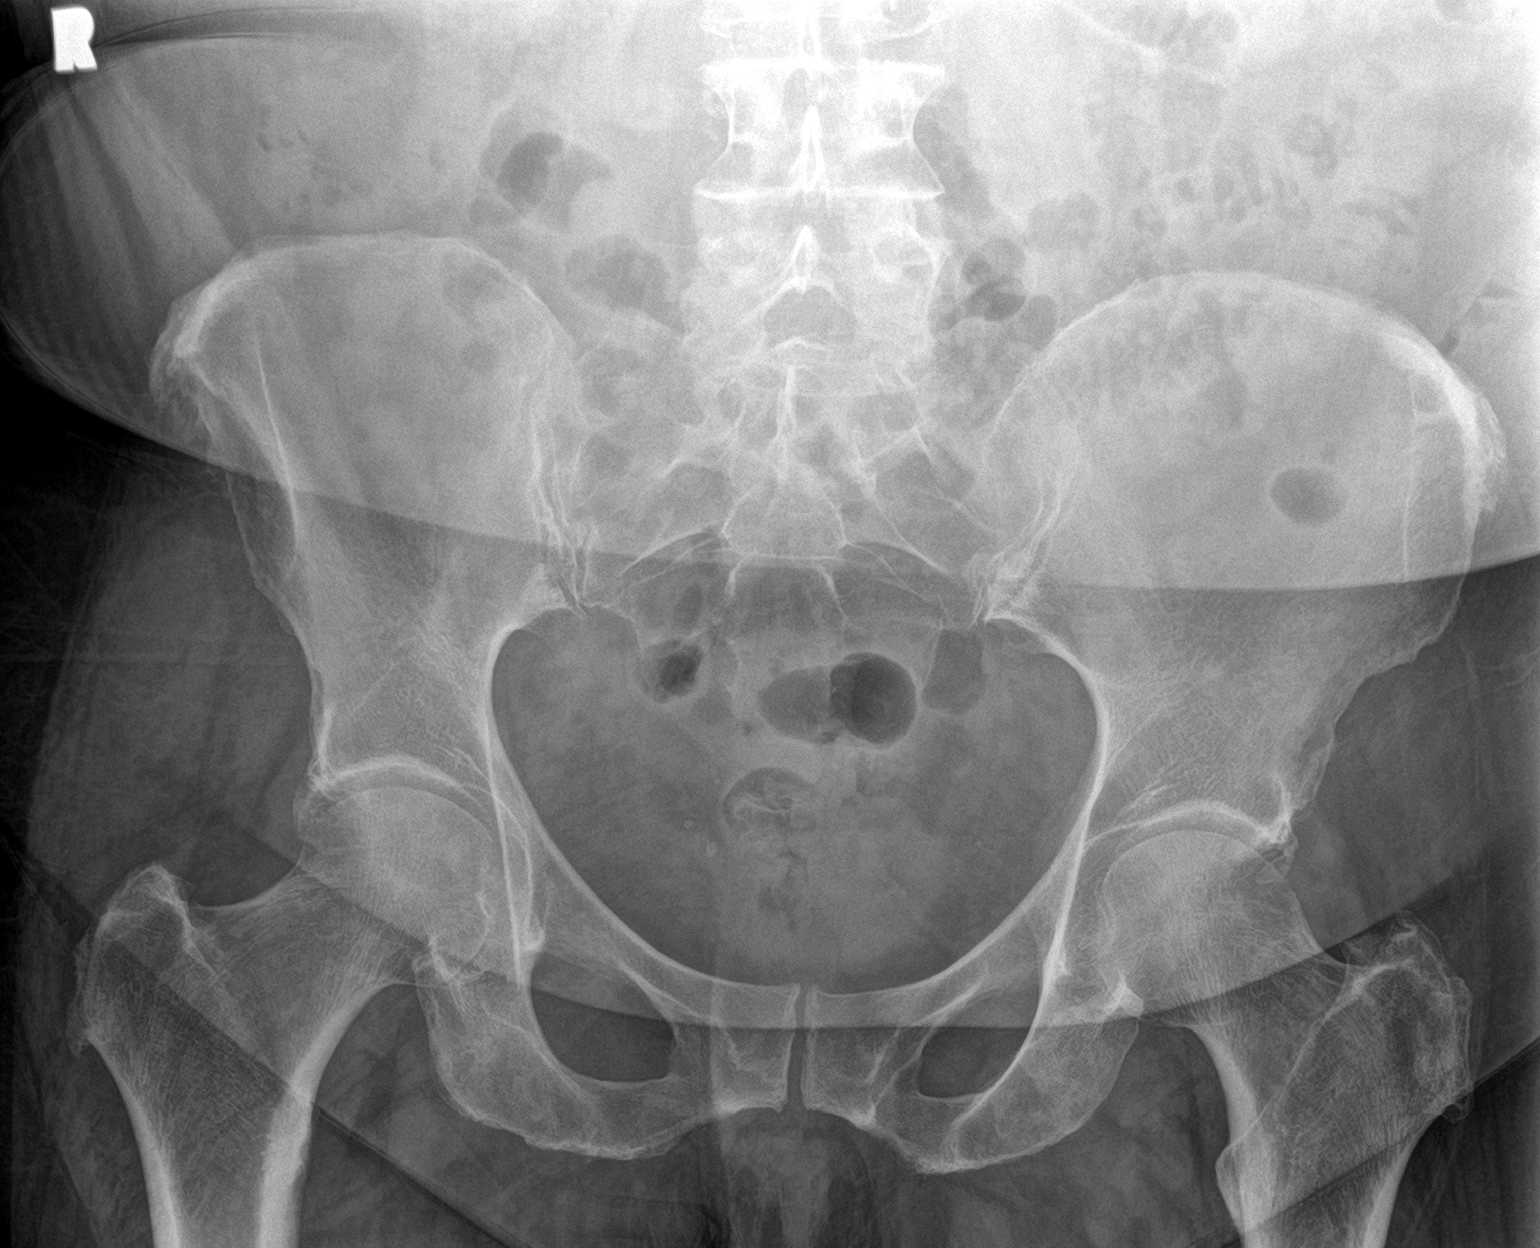

[hip ap]
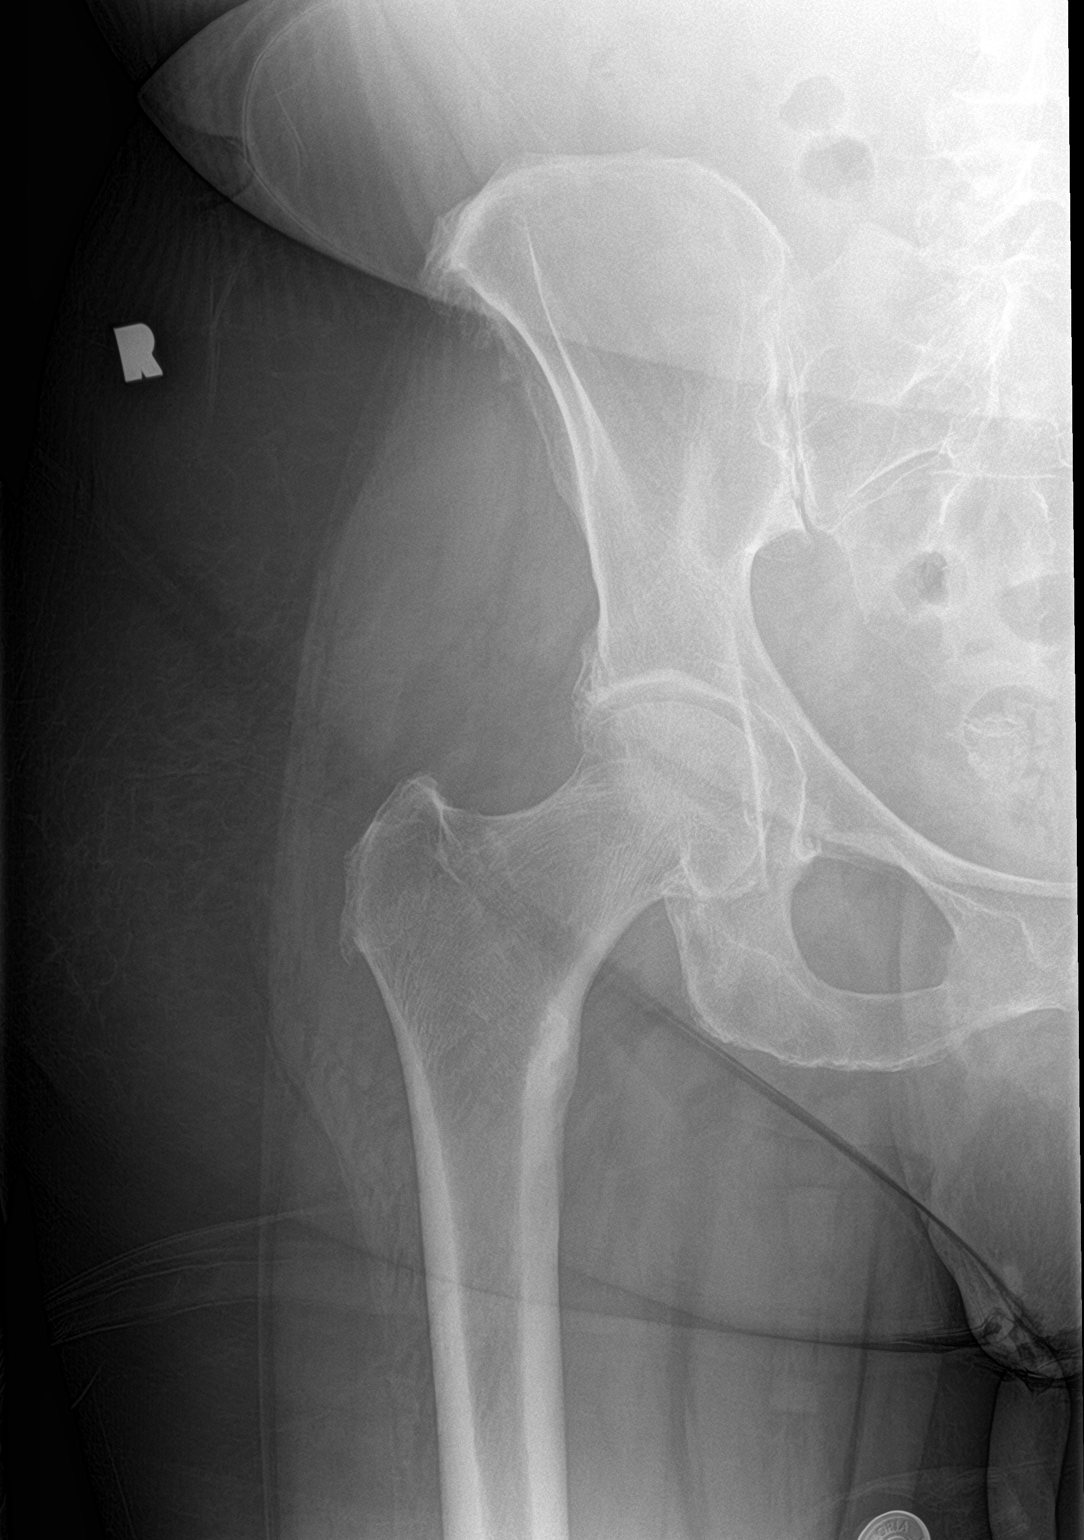

[hip lat]
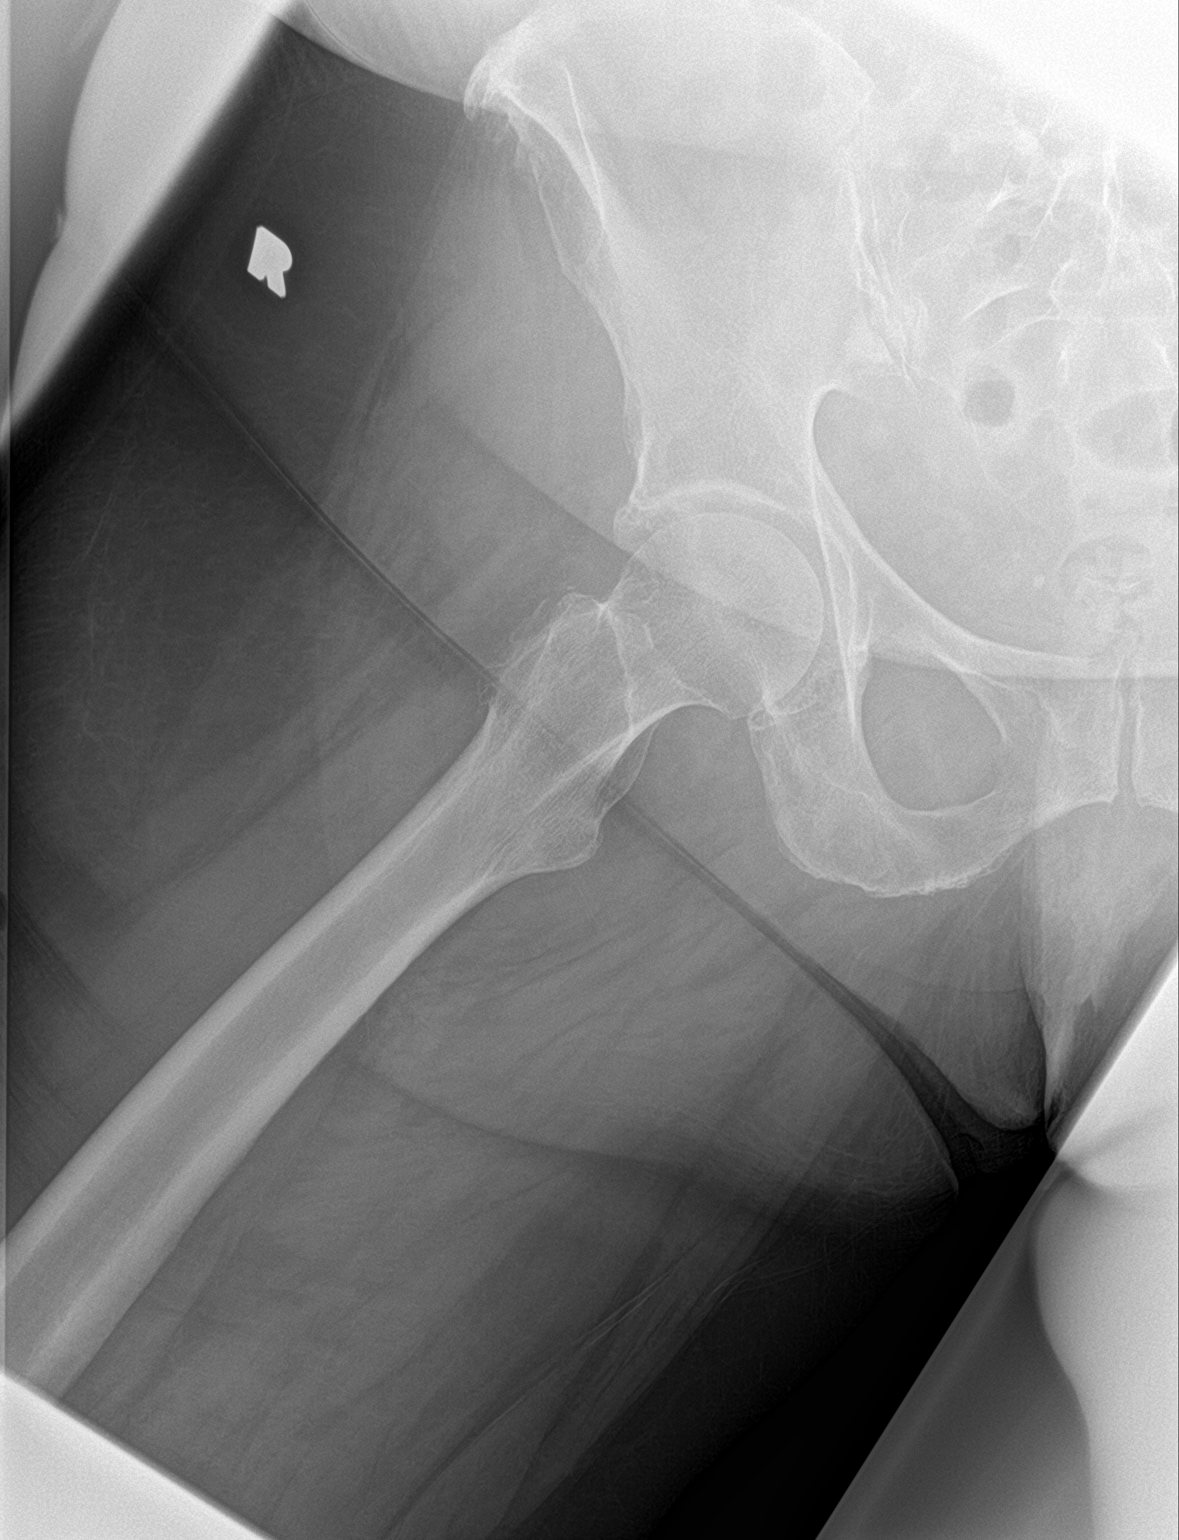

[3 of 3 positions shown; findings below may reference images not displayed]

FINDINGS: Acetabular spurring bilaterally with suspected mild subcortical cyst
formation along the right upper acetabulum. Minimal spurring of the
right femoral head. Preserved articular cartilage space. No fracture
is identified.
IMPRESSION: 1. No acute bony findings. If pain persists despite conservative
therapy, MRI or CT of the pelvis may be warranted for further
characterization.

## 2023-09-19 ENCOUNTER — Other Ambulatory Visit: Payer: Self-pay | Admitting: Family Medicine

## 2023-09-19 DIAGNOSIS — F331 Major depressive disorder, recurrent, moderate: Secondary | ICD-10-CM

## 2023-10-02 NOTE — Patient Instructions (Incomplete)
It was good to see you again today, I will be in touch with your labs Pneumonia vaccine given today  We will try a course of prednisone for your sciatica- let me know if this does not help and we can look further

## 2023-10-02 NOTE — Progress Notes (Unsigned)
[facility] [address] [place], [place] [address] [phone]  Date:  10/05/2023   Name:  Taylor Morales   DOB:  [birth year]   MRN:  [id number]  PCP:  Pearline Cables, MD    Chief Complaint: No chief complaint on file.   History of Present Illness:  Taylor Morales is a 67 y.o. very pleasant female patient who presents with the following:  Patient seen today for physical exam Most recent visit with myself was about 1 year ago, September 2023; at that time she had stopped drinking about 5 months prior   History of prediabetes, obesity, hyperlipidemia, hypertension, alcohol abuse  Flu vaccine COVID booster Pneumonia vaccine Shingrix Colonoscopy due next year-completed in 2020 Mammogram-December 2023 DEXA scan-1 year ago, up-to-date Cervical cancer screening-2019, can offer to update  Amlodipine 5 Prozac 40 losartan/hydrochlorothiazide  We prescribed Wegovy last year but I do not think she has continued to take it Patient Active Problem List   Diagnosis Date Noted   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/08/2020   Conjunctivitis 09/21/2020   Bronchitis 09/21/2020   Pre-diabetes 11/02/2016   Alcoholism ([facility]) 04/17/2015   Former smoker 04/17/2015   Obesity 02/27/2010   FOOT PAIN, RIGHT 05/16/2009   Hyperlipidemia 09/29/2007   Depression 09/29/2007   Essential hypertension 09/29/2007   Allergic rhinitis 09/29/2007    Past Medical History:  Diagnosis Date   Allergy    rhinitis   Depression    stress related in past, on prozac-took self off   Hyperlipidemia    Hypertension     Past Surgical History:  Procedure Laterality Date   BREAST LUMPECTOMY     benign   CESAREAN SECTION     x2   NASAL SINUS SURGERY     x2   TONSILLECTOMY     as child    Social History   Tobacco Use   Smoking status: Former    Current packs/day: 0.00    Average packs/day: 1 pack/day for 13.0 years (13.0 ttl pk-yrs)     Types: Cigarettes    Start date: 11/29/1969    Quit date: 11/29/1982    Years since quitting: 40.8   Smokeless tobacco: Current  Vaping Use   Vaping status: Never Used  Substance Use Topics   Alcohol use: Yes    Comment: daily   Drug use: No    Family History  Problem Relation Age of Onset   Lymphoma Mother        around 36   Breast cancer Mother        age 53   Pancreatic cancer Mother        ? pancreatic as had whipple   Other Father        unknown   Lymphoma Maternal Uncle     No Known Allergies  Medication list has been reviewed and updated.  Current Outpatient Medications on File Prior to Visit  Medication Sig Dispense Refill   acetaminophen (TYLENOL) 500 MG tablet Take 500 mg by mouth every 6 (six) hours as needed.     amLODipine (NORVASC) 5 MG tablet TAKE ONE TABLET BY MOUTH ONCE A DAY 90 tablet 0   azithromycin (ZITHROMAX) 250 MG tablet Use as a zpack 6 tablet 0   FLUoxetine (PROZAC) 40 MG capsule Take 1 capsule (40 mg total) by mouth daily. 30 capsule 0   Krill Oil Omega-3 500 MG CAPS Take 500 mg  by mouth daily.     losartan-hydrochlorothiazide (HYZAAR) 100-25 MG tablet TAKE ONE TABLET BY MOUTH ONCE A DAY 90 tablet 0   Multiple Vitamin (MULTIVITAMIN WITH MINERALS) TABS tablet Take 1 tablet by mouth daily. Centrum     oxymetazoline (AFRIN) 0.05 % nasal spray Place 1 spray into both nostrils daily as needed for congestion.     Semaglutide-Weight Management (WEGOVY) 0.25 MG/0.5ML SOAJ Inject 0.25 mg once weekly for one month, then increase to 0.5 mg weekly 2 mL 2   TURMERIC PO Take 1 tablet by mouth daily.     valACYclovir (VALTREX) 500 MG tablet Take 1 tablet (500 mg total) by mouth 2 (two) times daily. Take twice a day for 3 days as needed for outbreak 30 tablet 3   [DISCONTINUED] fluticasone (FLONASE) 50 MCG/ACT nasal spray Place 2 sprays into both nostrils daily. (Patient not taking: Reported on 02/29/2020) 16 g 12   [DISCONTINUED] fluticasone (VERAMYST) 27.5  MCG/SPRAY nasal spray Place 1 spray into the nose 2 (two) times daily. 30 g 3   No current facility-administered medications on file prior to visit.    Review of Systems:  As per HPI- otherwise negative.   Physical Examination: There were no vitals filed for this visit. There were no vitals filed for this visit. There is no height or weight on file to calculate BMI. Ideal Body Weight:    GEN: no acute distress. HEENT: Atraumatic, Normocephalic.  Ears and Nose: No external deformity. CV: RRR, No M/G/R. No JVD. No thrill. No extra heart sounds. PULM: CTA B, no wheezes, crackles, rhonchi. No retractions. No resp. distress. No accessory muscle use. ABD: S, NT, ND, +BS. No rebound. No HSM. EXTR: No c/c/e PSYCH: Normally interactive. Conversant.    Assessment and Plan: *** Physical exam today.  Encouraged healthy diet and exercise routine Will plan further follow- up pending labs.  Signed Abbe Amsterdam, MD

## 2023-10-05 ENCOUNTER — Encounter: Payer: Self-pay | Admitting: Family Medicine

## 2023-10-05 ENCOUNTER — Ambulatory Visit (INDEPENDENT_AMBULATORY_CARE_PROVIDER_SITE_OTHER): Payer: Medicare HMO | Admitting: Family Medicine

## 2023-10-05 ENCOUNTER — Other Ambulatory Visit (HOSPITAL_COMMUNITY)
Admission: RE | Admit: 2023-10-05 | Discharge: 2023-10-05 | Disposition: A | Discharge status: Home / Self Care | Payer: Medicare HMO | Source: Ambulatory Visit | Attending: Family Medicine | Admitting: Family Medicine

## 2023-10-05 VITALS — BP 118/80 | HR 69 | Temp 98.3°F | Resp 18 | Ht 68.0 in | Wt 257.6 lb

## 2023-10-05 DIAGNOSIS — R7303 Prediabetes: Secondary | ICD-10-CM | POA: Diagnosis not present

## 2023-10-05 DIAGNOSIS — E785 Hyperlipidemia, unspecified: Secondary | ICD-10-CM

## 2023-10-05 DIAGNOSIS — Z124 Encounter for screening for malignant neoplasm of cervix: Secondary | ICD-10-CM | POA: Insufficient documentation

## 2023-10-05 DIAGNOSIS — M5432 Sciatica, left side: Secondary | ICD-10-CM

## 2023-10-05 DIAGNOSIS — F331 Major depressive disorder, recurrent, moderate: Secondary | ICD-10-CM

## 2023-10-05 DIAGNOSIS — B009 Herpesviral infection, unspecified: Secondary | ICD-10-CM | POA: Diagnosis not present

## 2023-10-05 DIAGNOSIS — Z1151 Encounter for screening for human papillomavirus (HPV): Secondary | ICD-10-CM | POA: Diagnosis not present

## 2023-10-05 DIAGNOSIS — Z01419 Encounter for gynecological examination (general) (routine) without abnormal findings: Secondary | ICD-10-CM | POA: Diagnosis not present

## 2023-10-05 DIAGNOSIS — R5383 Other fatigue: Secondary | ICD-10-CM

## 2023-10-05 DIAGNOSIS — Z23 Encounter for immunization: Secondary | ICD-10-CM | POA: Diagnosis not present

## 2023-10-05 DIAGNOSIS — I10 Essential (primary) hypertension: Secondary | ICD-10-CM

## 2023-10-05 DIAGNOSIS — Z0000 Encounter for general adult medical examination without abnormal findings: Secondary | ICD-10-CM | POA: Diagnosis not present

## 2023-10-05 LAB — COMPREHENSIVE METABOLIC PANEL
ALT: 17 U/L (ref 0–35)
AST: 17 U/L (ref 0–37)
Albumin: 4.4 g/dL (ref 3.5–5.2)
Alkaline Phosphatase: 84 U/L (ref 39–117)
BUN: 16 mg/dL (ref 6–23)
CO2: 26 meq/L (ref 19–32)
Calcium: 9.4 mg/dL (ref 8.4–10.5)
Chloride: 101 meq/L (ref 96–112)
Creatinine, Ser: 0.88 mg/dL (ref 0.40–1.20)
GFR: 68.01 mL/min (ref >60.00)
Glucose, Bld: 112 mg/dL — ABNORMAL HIGH (ref 70–99)
Potassium: 4.2 meq/L (ref 3.5–5.1)
Sodium: 137 meq/L (ref 135–145)
Total Bilirubin: 0.5 mg/dL (ref 0.2–1.2)
Total Protein: 7 g/dL (ref 6.0–8.3)

## 2023-10-05 LAB — CBC
HCT: 40.4 % (ref 36.0–46.0)
Hemoglobin: 13.4 g/dL (ref 12.0–15.0)
MCHC: 33.2 g/dL (ref 30.0–36.0)
MCV: 93.4 fL (ref 78.0–100.0)
Platelets: 365 10*3/uL (ref 150.0–400.0)
RBC: 4.32 Mil/uL (ref 3.87–5.11)
RDW: 13.2 % (ref 11.5–15.5)
WBC: 7.5 10*3/uL (ref 4.0–10.5)

## 2023-10-05 LAB — HEMOGLOBIN A1C: Hgb A1c MFr Bld: 6.3 % (ref 4.6–6.5)

## 2023-10-05 LAB — LIPID PANEL
Cholesterol: 237 mg/dL — ABNORMAL HIGH (ref 0–200)
HDL: 73.3 mg/dL (ref >39.00)
LDL Cholesterol: 144 mg/dL — ABNORMAL HIGH (ref 0–99)
NonHDL: 164.03
Total CHOL/HDL Ratio: 3
Triglycerides: 99 mg/dL (ref 0.0–149.0)
VLDL: 19.8 mg/dL (ref 0.0–40.0)

## 2023-10-05 LAB — VITAMIN D 25 HYDROXY (VIT D DEFICIENCY, FRACTURES): VITD: 31.19 ng/mL (ref 30.00–100.00)

## 2023-10-05 LAB — TSH: TSH: 2.03 u[IU]/mL (ref 0.35–5.50)

## 2023-10-05 MED ORDER — VALACYCLOVIR HCL 500 MG PO TABS: 500.0000 mg | ORAL_TABLET | Freq: Two times a day (BID) | ORAL | 3 refills | Status: AC

## 2023-10-05 MED ORDER — LOSARTAN POTASSIUM-HCTZ 100-25 MG PO TABS: 1.0000 | ORAL_TABLET | Freq: Every day | ORAL | 3 refills | Status: DC

## 2023-10-05 MED ORDER — PREDNISONE 20 MG PO TABS: ORAL_TABLET | ORAL | 0 refills | Status: AC

## 2023-10-05 MED ORDER — FLUOXETINE HCL 40 MG PO CAPS: 40.0000 mg | ORAL_CAPSULE | Freq: Every day | ORAL | 3 refills | Status: DC

## 2023-10-05 MED ORDER — AMLODIPINE BESYLATE 5 MG PO TABS: 5.0000 mg | ORAL_TABLET | Freq: Every day | ORAL | 3 refills | Status: DC

## 2023-10-05 MED ORDER — PREDNISONE 20 MG PO TABS: ORAL_TABLET | ORAL | 0 refills | Status: DC

## 2023-10-10 ENCOUNTER — Encounter: Payer: Self-pay | Admitting: Family Medicine

## 2023-10-10 LAB — CYTOLOGY - PAP
Comment: NEGATIVE
Diagnosis: UNDETERMINED — AB
High risk HPV: NEGATIVE

## 2023-10-13 ENCOUNTER — Other Ambulatory Visit: Payer: Self-pay | Admitting: Family Medicine

## 2023-10-13 DIAGNOSIS — B009 Herpesviral infection, unspecified: Secondary | ICD-10-CM

## 2023-11-08 DIAGNOSIS — Z1231 Encounter for screening mammogram for malignant neoplasm of breast: Secondary | ICD-10-CM | POA: Diagnosis not present

## 2023-11-08 LAB — HM MAMMOGRAPHY

## 2023-12-09 DIAGNOSIS — D1801 Hemangioma of skin and subcutaneous tissue: Secondary | ICD-10-CM | POA: Diagnosis not present

## 2023-12-09 DIAGNOSIS — L723 Sebaceous cyst: Secondary | ICD-10-CM | POA: Diagnosis not present

## 2023-12-09 DIAGNOSIS — L570 Actinic keratosis: Secondary | ICD-10-CM | POA: Diagnosis not present

## 2023-12-09 DIAGNOSIS — Z85828 Personal history of other malignant neoplasm of skin: Secondary | ICD-10-CM | POA: Diagnosis not present

## 2023-12-09 DIAGNOSIS — L300 Nummular dermatitis: Secondary | ICD-10-CM | POA: Diagnosis not present

## 2023-12-09 DIAGNOSIS — L820 Inflamed seborrheic keratosis: Secondary | ICD-10-CM | POA: Diagnosis not present

## 2023-12-09 DIAGNOSIS — L821 Other seborrheic keratosis: Secondary | ICD-10-CM | POA: Diagnosis not present

## 2024-03-08 DIAGNOSIS — L570 Actinic keratosis: Secondary | ICD-10-CM | POA: Diagnosis not present

## 2024-03-08 DIAGNOSIS — Z85828 Personal history of other malignant neoplasm of skin: Secondary | ICD-10-CM | POA: Diagnosis not present

## 2024-03-08 DIAGNOSIS — L820 Inflamed seborrheic keratosis: Secondary | ICD-10-CM | POA: Diagnosis not present

## 2024-04-30 ENCOUNTER — Encounter (HOSPITAL_BASED_OUTPATIENT_CLINIC_OR_DEPARTMENT_OTHER): Payer: Self-pay

## 2024-04-30 ENCOUNTER — Emergency Department (HOSPITAL_BASED_OUTPATIENT_CLINIC_OR_DEPARTMENT_OTHER)
Admission: EM | Admit: 2024-04-30 | Discharge: 2024-04-30 | Disposition: A | Discharge status: Home / Self Care | Attending: Emergency Medicine | Admitting: Emergency Medicine

## 2024-04-30 ENCOUNTER — Other Ambulatory Visit: Payer: Self-pay

## 2024-04-30 ENCOUNTER — Emergency Department (HOSPITAL_BASED_OUTPATIENT_CLINIC_OR_DEPARTMENT_OTHER)

## 2024-04-30 DIAGNOSIS — S0990XA Unspecified injury of head, initial encounter: Secondary | ICD-10-CM | POA: Diagnosis not present

## 2024-04-30 DIAGNOSIS — S060X0A Concussion without loss of consciousness, initial encounter: Secondary | ICD-10-CM

## 2024-04-30 DIAGNOSIS — Z79899 Other long term (current) drug therapy: Secondary | ICD-10-CM | POA: Diagnosis not present

## 2024-04-30 DIAGNOSIS — W06XXXA Fall from bed, initial encounter: Secondary | ICD-10-CM | POA: Insufficient documentation

## 2024-04-30 DIAGNOSIS — I10 Essential (primary) hypertension: Secondary | ICD-10-CM | POA: Insufficient documentation

## 2024-04-30 DIAGNOSIS — I6782 Cerebral ischemia: Secondary | ICD-10-CM | POA: Diagnosis not present

## 2024-04-30 DIAGNOSIS — W19XXXA Unspecified fall, initial encounter: Secondary | ICD-10-CM

## 2024-04-30 NOTE — ED Provider Notes (Signed)
 [place] EMERGENCY DEPARTMENT AT [facility] Provider Note   CSN: [id number] Arrival date & time: 04/30/24  1115     History  Chief Complaint  Patient presents with   Taylor Morales is a 67 y.o. female.  68 year old female presents today for concern of a fall that occurred last night around 10 PM.  She fell out of bed and struck her head on the nightstand.  No loss of consciousness.  Husband who is at bedside stated that when he initially assessed her she was confused however has been at her baseline most of the night and all of this morning.  She was seen by dentist due to loss of tooth during the fall.  He was unable to replace it so she has an appointment later this afternoon with periodontist at 2 PM.  Currently denies any other complaints.  Not on anticoagulation.  No loss of consciousness.  The history is provided by the patient. No language interpreter was used.       Home Medications Prior to Admission medications   Medication Sig Start Date End Date Taking? Authorizing Provider  acetaminophen  (TYLENOL ) 500 MG tablet Take 500 mg by mouth every 6 (six) hours as needed.    [provider]  amLODipine  (NORVASC ) 5 MG tablet Take 1 tablet (5 mg total) by mouth daily. 10/05/23   Copland, Skipper Dumas, MD  FLUoxetine  (PROZAC ) 40 MG capsule Take 1 capsule (40 mg total) by mouth daily. 10/05/23   Copland, Jessica C, MD  Krill Oil Omega-3 500 MG CAPS Take 500 mg by mouth daily.    [provider]  losartan -hydrochlorothiazide  (HYZAAR) 100-25 MG tablet Take 1 tablet by mouth daily. 10/05/23   Copland, Jessica C, MD  Multiple Vitamin (MULTIVITAMIN WITH MINERALS) TABS tablet Take 1 tablet by mouth daily. Centrum    [provider]  oxymetazoline (AFRIN) 0.05 % nasal spray Place 1 spray into both nostrils daily as needed for congestion.    [provider]  predniSONE  (DELTASONE ) 20 MG tablet Take 40 mg by mouth daily for 4 days, then 20 mg by  mouth daily for 4 days 10/05/23   Copland, Skipper Dumas, MD  TURMERIC PO Take 1 tablet by mouth daily.    [provider]  valACYclovir  (VALTREX ) 500 MG tablet Take 1 tablet (500 mg total) by mouth 2 (two) times daily. Take twice a day for 3 days as needed for outbreak 10/05/23   Copland, Skipper Dumas, MD  fluticasone  (FLONASE ) 50 MCG/ACT nasal spray Place 2 sprays into both nostrils daily. Patient not taking: Reported on 02/29/2020 08/31/18 02/29/20  Copland, Skipper Dumas, MD  fluticasone  (VERAMYST) 27.5 MCG/SPRAY nasal spray Place 1 spray into the nose 2 (two) times daily. 08/26/11 02/18/12  Janece Means, MD      Allergies    Patient has no known allergies.    Review of Systems   Review of Systems  Constitutional:  Negative for activity change, chills and fever.  Gastrointestinal:  Negative for abdominal pain.  Neurological:  Negative for syncope and headaches.  All other systems reviewed and are negative.   Physical Exam Updated Vital Signs BP (!) 148/116 (BP Location: Right Arm)   Pulse 98   Temp 97.6 F (36.4 C)   Resp 16   Ht 5\' 8"  (1.727 m)   Wt 108 kg   SpO2 98%   BMI 36.19 kg/m  Physical Exam Vitals and nursing note reviewed.  Constitutional:  General: She is not in acute distress.    Appearance: Normal appearance. She is not ill-appearing.  HENT:     Head: Normocephalic and atraumatic.     Nose: Nose normal.     Mouth/Throat:   Eyes:     Extraocular Movements: Extraocular movements intact.     Conjunctiva/sclera: Conjunctivae normal.     Pupils: Pupils are equal, round, and reactive to light.  Cardiovascular:     Rate and Rhythm: Normal rate and regular rhythm.  Pulmonary:     Effort: Pulmonary effort is normal. No respiratory distress.  Musculoskeletal:        General: No deformity. Normal range of motion.     Cervical back: Normal range of motion.     Comments: Spine well aligned and without tenderness to palpation over C, T, and L-spine.  No range of  motion bilateral upper and lower extremities.  Skin:    Findings: No rash.  Neurological:     General: No focal deficit present.     Mental Status: She is alert and oriented to person, place, and time. Mental status is at baseline.     Cranial Nerves: No cranial nerve deficit.     ED Results / Procedures / Treatments   Labs (all labs ordered are listed, but only abnormal results are displayed) Labs Reviewed - No data to display  EKG None  Radiology No results found.  Procedures Procedures    Medications Ordered in ED Medications - No data to display  ED Course/ Medical Decision Making/ A&P                                 Medical Decision Making Amount and/or Complexity of Data Reviewed Radiology: ordered.   Medical Decision Making / ED Course   This patient presents to the ED for concern of fall, this involves an extensive number of treatment options, and is a complaint that carries with it a high risk of complications and morbidity.  The differential diagnosis includes concussion, intracranial bleed, fracture  MDM: 68 year old female presents today for concern of a fall that occurred last night.  Initial confusion but has been baseline most of overnight and through this morning.  Seen her dentist and has to follow-up with periodontist this afternoon. CT head obtained.  No acute intracranial process. Concussion protocol discussed.  She will follow-up with her PCP.  She will go to the periodontist now. Discharged in stable condition. Return precautions discussed.   Additional history obtained: -Additional history obtained from husband at bedside -External records from outside source obtained and reviewed including: Chart review including previous notes, labs, imaging, consultation notes   Lab Tests: -I ordered, reviewed, and interpreted labs.   The pertinent results include:   Labs Reviewed - No data to display    EKG  EKG Interpretation Date/Time:     Ventricular Rate:    PR Interval:    QRS Duration:    QT Interval:    QTC Calculation:   R Axis:      Text Interpretation:           Imaging Studies ordered: I ordered imaging studies including CT head I independently visualized and interpreted imaging. I agree with the radiologist interpretation   Medicines ordered and prescription drug management: No orders of the defined types were placed in this encounter.   -I have reviewed the patients home medicines and have made adjustments as  needed    Social Determinants of Health:  Factors impacting patients care include: Good outpatient follow-up   Reevaluation: After the interventions noted above, I reevaluated the patient and found that they have :improved  Co morbidities that complicate the patient evaluation  Past Medical History:  Diagnosis Date   Allergy    rhinitis   Depression    stress related in past, on prozac -took self off   Hyperlipidemia    Hypertension       Dispostion: Discharged in stable condition.  Return precaution discussed.  Patient voices understanding and is in agreement with plan.   Final Clinical Impression(s) / ED Diagnoses Final diagnoses:  Concussion without loss of consciousness, initial encounter  Fall, initial encounter    Rx / DC Orders ED Discharge Orders     None         Lucina Sabal, PA-C 04/30/24 1428    Lind Repine, MD 04/30/24 1526

## 2024-04-30 NOTE — ED Triage Notes (Signed)
 Pt fell out of bed and hit her night stand. Happened around 10:30 last night. Did loose a tooth. Was disoriented at the time. Went to the dentist today and he asked her to come and be checked out. Took aleve around 4:30 this am.

## 2024-04-30 NOTE — Discharge Instructions (Signed)
 CT scan of the head without any concerning findings.  You likely have a concussion.  Symptoms last about a week for this and include mild headache, occasional nausea and vomiting.  Take Tylenol  and ibuprofen as you need to for the headache.  I have listed a sports medicine clinic to follow-up with for concussion if your symptoms last longer than 1 week.  Return for any concerning symptoms.

## 2024-08-20 ENCOUNTER — Emergency Department (HOSPITAL_BASED_OUTPATIENT_CLINIC_OR_DEPARTMENT_OTHER)

## 2024-08-20 ENCOUNTER — Other Ambulatory Visit: Payer: Self-pay

## 2024-08-20 ENCOUNTER — Other Ambulatory Visit (HOSPITAL_BASED_OUTPATIENT_CLINIC_OR_DEPARTMENT_OTHER): Payer: Self-pay

## 2024-08-20 ENCOUNTER — Emergency Department (HOSPITAL_BASED_OUTPATIENT_CLINIC_OR_DEPARTMENT_OTHER): Admitting: Radiology

## 2024-08-20 ENCOUNTER — Encounter (HOSPITAL_BASED_OUTPATIENT_CLINIC_OR_DEPARTMENT_OTHER): Payer: Self-pay

## 2024-08-20 ENCOUNTER — Emergency Department (HOSPITAL_BASED_OUTPATIENT_CLINIC_OR_DEPARTMENT_OTHER)
Admission: EM | Admit: 2024-08-20 | Discharge: 2024-08-20 | Disposition: A | Discharge status: Home / Self Care | Attending: Emergency Medicine | Admitting: Emergency Medicine

## 2024-08-20 DIAGNOSIS — M7121 Synovial cyst of popliteal space [Baker], right knee: Secondary | ICD-10-CM | POA: Diagnosis not present

## 2024-08-20 DIAGNOSIS — M25561 Pain in right knee: Secondary | ICD-10-CM | POA: Diagnosis not present

## 2024-08-20 DIAGNOSIS — M1711 Unilateral primary osteoarthritis, right knee: Secondary | ICD-10-CM | POA: Diagnosis not present

## 2024-08-20 MED ORDER — OXYCODONE HCL 5 MG PO TABS
5.0000 mg | ORAL_TABLET | ORAL | 0 refills | Status: AC | PRN
  Filled 2024-08-20: qty 10, 2d supply, fill #0

## 2024-08-20 MED ORDER — HYDROCODONE-ACETAMINOPHEN 5-325 MG PO TABS
1.0000 | ORAL_TABLET | Freq: Once | ORAL | Status: AC
  Administered 2024-08-20: 1 via ORAL
  Filled 2024-08-20: qty 1

## 2024-08-20 NOTE — ED Triage Notes (Addendum)
 Arrives ambulatory to the ED with complaints of worsening right knee pain x1 week. No fall or injuries. Rates pain a 3/10.  Reports blood clot 40 years ago.

## 2024-08-20 NOTE — ED Notes (Signed)
 Patient transported to Ultrasound

## 2024-08-20 NOTE — ED Provider Notes (Signed)
 [facility] Provider Note   CSN: [id number] Arrival date & time: 08/20/24  1202     Patient presents with: Knee Pain (right)   Taylor Morales is a 68 y.o. female.   68 year old female with remote history of DVT as well as osteoarthritis who presents emergency department with atraumatic right knee pain.  Patient reports that for the past week she has been having progressive worsening pain in her right knee.  Describes it as being on the medial aspect of her knee as well as in the joint itself.  No redness or warmth.  No fevers.  No leg swelling otherwise.  No history of gout.  No chest pain or shortness of breath. Is not currently on [facility].  Has tried a knee sleeve, Tylenol , and lidocaine patches without relief       Prior to Admission medications   Medication Sig Start Date End Date Taking? Authorizing Provider  oxyCODONE  (ROXICODONE ) 5 MG immediate release tablet Take 1 tablet (5 mg total) by mouth every 4 (four) hours as needed. 08/20/24  Yes Yolande Lamar BROCKS, MD  acetaminophen  (TYLENOL ) 500 MG tablet Take 500 mg by mouth every 6 (six) hours as needed.    [provider]  amLODipine  (NORVASC ) 5 MG tablet Take 1 tablet (5 mg total) by mouth daily. 10/05/23   Copland, Harlene BROCKS, MD  FLUoxetine  (PROZAC ) 40 MG capsule Take 1 capsule (40 mg total) by mouth daily. 10/05/23   Copland, Jessica C, MD  Krill Oil Omega-3 500 MG CAPS Take 500 mg by mouth daily.    [provider]  losartan -hydrochlorothiazide  (HYZAAR) 100-25 MG tablet Take 1 tablet by mouth daily. 10/05/23   Copland, Jessica C, MD  Multiple Vitamin (MULTIVITAMIN WITH MINERALS) TABS tablet Take 1 tablet by mouth daily. Centrum    [provider]  oxymetazoline (AFRIN) 0.05 % nasal spray Place 1 spray into both nostrils daily as needed for congestion.    [provider]  predniSONE  (DELTASONE ) 20 MG tablet Take 40 mg by mouth daily for 4 days, then 20 mg by  mouth daily for 4 days 10/05/23   Copland, Harlene BROCKS, MD  TURMERIC PO Take 1 tablet by mouth daily.    [provider]  valACYclovir  (VALTREX ) 500 MG tablet Take 1 tablet (500 mg total) by mouth 2 (two) times daily. Take twice a day for 3 days as needed for outbreak 10/05/23   Copland, Harlene BROCKS, MD  fluticasone  (FLONASE ) 50 MCG/ACT nasal spray Place 2 sprays into both nostrils daily. Patient not taking: Reported on 02/29/2020 08/31/18 02/29/20  Copland, Harlene BROCKS, MD  fluticasone  (VERAMYST) 27.5 MCG/SPRAY nasal spray Place 1 spray into the nose 2 (two) times daily. 08/26/11 02/18/12  Mavis Norleen BRAVO, MD    Allergies: Patient has no known allergies.    Review of Systems  Updated Vital Signs BP 139/86   Pulse 83   Temp 98.1 F (36.7 C) (Oral)   Resp 20   Ht 5' 8 (1.727 m)   Wt 108 kg   SpO2 99%   BMI 36.20 kg/m   Physical Exam Constitutional:      Appearance: Normal appearance.  Musculoskeletal:     Right lower leg: No edema.     Left lower leg: No edema.     Comments: Small effusion of the right knee.  Full range of motion of the right knee.  No erythema or warmth of the knee.  No focal tenderness  to palpation of the patella, distal femur, proximal tibia or fibula.  DP pulses 2+ bilaterally.  Neurological:     Mental Status: She is alert.     (all labs ordered are listed, but only abnormal results are displayed) Labs Reviewed - No data to display  EKG: None  Radiology: US  Venous Img Lower Unilateral Right Result Date: 08/20/2024 CLINICAL DATA:  Right knee pain EXAM: RIGHT LOWER EXTREMITY VENOUS DOPPLER ULTRASOUND TECHNIQUE: Gray-scale sonography with compression, as well as color and duplex ultrasound, were performed to evaluate the deep venous system(s) from the level of the common femoral vein through the popliteal and proximal calf veins. COMPARISON:  None Available. FINDINGS: VENOUS Normal compressibility of the common femoral, superficial femoral, and popliteal  veins, as well as the visualized calf veins. Visualized portions of profunda femoral vein and great saphenous vein unremarkable. No filling defects to suggest DVT on grayscale or color Doppler imaging. Doppler waveforms show normal direction of venous flow, normal respiratory plasticity and response to augmentation. Limited views of the contralateral common femoral vein are unremarkable. OTHER Mildly complex fluid collection in the popliteal fossa measures 4.5 x 1.5 x 3.3 cm. Limitations: none IMPRESSION: 1. Negative for DVT in the left lower extremity. 2. Mildly complex Baker's cyst. Electronically Signed   By: Wilkie Lent M.D.   On: 08/20/2024 15:02   DG Knee Complete 4 Views Right Result Date: 08/20/2024 CLINICAL DATA:  Right knee pain EXAM: RIGHT KNEE - COMPLETE 4+ VIEW COMPARISON:  None Available. FINDINGS: Tricompartmental spurring with substantial loss of articular space in the patellofemoral compartment. No large knee effusion identified, smaller effusions may be occult due to the degree of flexion on the lateral projection. No fracture or acute bony findings identified. IMPRESSION: 1. 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 Electronically Signed   By: Ryan Salvage M.D.   On: 08/20/2024 13:13     Procedures   Medications Ordered in the ED  HYDROcodone -acetaminophen  (NORCO/VICODIN) 5-325 MG per tablet 1 tablet (1 tablet Oral Given 08/20/24 1341)                                    Medical Decision Making Amount and/or Complexity of Data Reviewed Radiology: ordered.  Risk Prescription drug management.   Taylor Morales is a 45 female history of DVT presents emergency with right knee pain  Initial Ddx:  Baker's cyst, DVT, fracture, septic joint, gout, inflammatory arthritis  MDM/Course:  Patient presents to the emergency department with pain of her right knee.  Atraumatic.  Does have a small effusion on exam but no signs of a septic  joint.  Low concern for gout given her exam and lack of erythema or warmth.  Does have palpable swelling posteriorly on her knee as well.  Went for an ultrasound which shows that she has a Baker's cyst.  No evidence of DVT.  X-ray with arthritis but no other acute findings.  Instructed on symptomatic care for her Baker's cyst and will have her follow-up with orthopedics if it does not improve  This patient presents to the ED for concern of complaints listed in HPI, this involves an extensive number of treatment options, and is a complaint that carries with it a high risk of complications and morbidity. Disposition including potential need for admission considered.   Dispo: DC Home. Return precautions discussed including, but not limited to, those listed in the AVS.  Allowed pt time to ask questions which were answered fully prior to dc.  Additional history obtained from spouse Records reviewed Outpatient Clinic Notes I independently reviewed the following imaging with scope of interpretation limited to determining acute life threatening conditions related to emergency care: Extremity x-ray(s) and agree with the radiologist interpretation with the following exceptions: none I have reviewed the patients home medications and made adjustments as needed Social Determinants of health:  Geriatric   Portions of this note were generated with Scientist, clinical (histocompatibility and immunogenetics). Dictation errors may occur despite best attempts at proofreading.     Final diagnoses:  Synovial cyst of right popliteal space    ED Discharge Orders          Ordered    oxyCODONE  (ROXICODONE ) 5 MG immediate release tablet  Every 4 hours PRN        08/20/24 1505               Yolande Lamar BROCKS, MD 08/20/24 [phone]

## 2024-08-20 NOTE — ED Notes (Signed)
 Reviewed AVS/discharge instruction with patient. Time allotted for and all questions answered. Patient is agreeable for d/c and escorted to ed exit by staff.

## 2024-08-20 NOTE — Discharge Instructions (Signed)
 You were seen for your Baker's cyst in the emergency department.   At home, please continue to use a compressive sleeve or Ace wrap on your knee as well as Tylenol .  You may also take the oxycodone  we have prescribed you for any breakthrough pain that may have.  Do not take this before driving or operating heavy machinery.  Do not take this medication with alcohol.  Check your MyChart online for the results of any tests that had not resulted by the time you left the emergency department.   Follow-up with orthopedics in 1-2 weeks regarding your visit.    Return immediately to the emergency department if you experience any of the following: Worsening pain, fevers, redness of the knee, worsening swelling, or any other concerning symptoms.    Thank you for visiting our Emergency Department. It was a pleasure taking care of you today.

## 2024-08-24 ENCOUNTER — Ambulatory Visit (HOSPITAL_BASED_OUTPATIENT_CLINIC_OR_DEPARTMENT_OTHER): Admitting: Student

## 2024-08-24 DIAGNOSIS — M1711 Unilateral primary osteoarthritis, right knee: Secondary | ICD-10-CM

## 2024-08-24 DIAGNOSIS — M25561 Pain in right knee: Secondary | ICD-10-CM | POA: Diagnosis not present

## 2024-08-24 NOTE — Progress Notes (Signed)
 Chief Complaint: Right knee pain    Discussed the use of AI scribe software for clinical note transcription with the patient, who gave verbal consent to proceed.  History of Present Illness Taylor Morales is a 67 year old female who presents with right knee pain and a Baker's cyst.  She has experienced severe right knee pain for the past week, which has worsened despite rest. She limits stair use at home due to pain. X-rays and ultrasound earlier this week in the ED confirmed a Baker's cyst. She applies ice to the knee and has hydrocodone  prescribed for pain, which she has not yet taken. The pain is localized to the medial and posterior knee, with no buckling, popping, or clicking. Bending the knee is painful, and she has been resting it for two weeks. Her left knee has historically been weaker, but the right knee is now more problematic.   Surgical History:   None  PMH/PSH/Family History/Social History/Meds/Allergies:    Past Medical History:  Diagnosis Date   Allergy    rhinitis   Depression    stress related in past, on prozac -took self off   Hyperlipidemia    Hypertension    Past Surgical History:  Procedure Laterality Date   BREAST LUMPECTOMY     benign   CESAREAN SECTION     x2   NASAL SINUS SURGERY     x2   TONSILLECTOMY     as child   Social History   Socioeconomic History   Marital status: Married    Spouse name: Not on file   Number of children: Not on file   Years of education: Not on file   Highest education level: Not on file  Occupational History   Not on file  Tobacco Use   Smoking status: Former    Current packs/day: 0.00    Average packs/day: 1 pack/day for 13.0 years (13.0 ttl pk-yrs)    Types: Cigarettes    Start date: 11/29/1969    Quit date: 11/29/1982    Years since quitting: 41.7   Smokeless tobacco: Current  Vaping Use   Vaping status: Never Used  Substance and Sexual Activity   Alcohol use: Yes     Comment: daily   Drug use: No   Sexual activity: Not on file  Other Topics Concern   Not on file  Social History Narrative   Married (husband patient of Dr.Hunter). 2 children- 31 and 27 in 2016-daughter lives in town, son in [place] . No grandkids-just granddogs.       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    Hopeful to start sailing again, former camping   Social Drivers of Corporate investment banker Strain: Not on file  Food Insecurity: Not on file  Transportation Needs: Not on file  Physical Activity: Not on file  Stress: Not on file  Social Connections: Unknown (04/12/2022)   [facility]   Social Network    Social Network: Not on file   Family History  Problem Relation Age of Onset   Lymphoma Mother        around 85   Breast cancer Mother        age 28   Pancreatic cancer Mother        ?  pancreatic as had whipple   Other Father        unknown   Lymphoma Maternal Uncle    No Known Allergies Current Outpatient Medications  Medication Sig Dispense Refill   acetaminophen  (TYLENOL ) 500 MG tablet Take 500 mg by mouth every 6 (six) hours as needed.     amLODipine  (NORVASC ) 5 MG tablet Take 1 tablet (5 mg total) by mouth daily. 90 tablet 3   FLUoxetine  (PROZAC ) 40 MG capsule Take 1 capsule (40 mg total) by mouth daily. 90 capsule 3   Krill Oil Omega-3 500 MG CAPS Take 500 mg by mouth daily.     losartan -hydrochlorothiazide  (HYZAAR) 100-25 MG tablet Take 1 tablet by mouth daily. 90 tablet 3   Multiple Vitamin (MULTIVITAMIN WITH MINERALS) TABS tablet Take 1 tablet by mouth daily. Centrum     oxyCODONE  (ROXICODONE ) 5 MG immediate release tablet Take 1 tablet (5 mg total) by mouth every 4 (four) hours as needed. 10 tablet 0   oxymetazoline (AFRIN) 0.05 % nasal spray Place 1 spray into both nostrils daily as needed for congestion.     predniSONE  (DELTASONE ) 20 MG tablet Take  40 mg by mouth daily for 4 days, then 20 mg by mouth daily for 4 days 12 tablet 0   TURMERIC PO Take 1 tablet by mouth daily.     valACYclovir  (VALTREX ) 500 MG tablet Take 1 tablet (500 mg total) by mouth 2 (two) times daily. Take twice a day for 3 days as needed for outbreak 30 tablet 3   No current facility-administered medications for this visit.   No results found.  Review of Systems:   A ROS was performed including pertinent positives and negatives as documented in the HPI.  Physical Exam :   Constitutional: NAD and appears stated age Neurological: Alert and oriented Psych: Appropriate affect and cooperative There were no vitals taken for this visit.   Comprehensive Musculoskeletal Exam:    Exam of the right knee demonstrates active range of motion from 0 to 110 degrees with palpable crepitus.  Positive for tenderness over the medial joint line.  There is a palpable Baker's cyst in the posterior knee.  Mild suprapatellar effusion without overlying erythema or warmth.  Imaging:   Xray review from ED on 08/20/2024 (right knee 4 views): Tricompartmental osteoarthritis which appears mild to moderate in the tibiofemoral components and moderate to advanced in the patellofemoral compartment   I personally reviewed and interpreted the radiographs.      Assessment & Plan Right knee osteoarthritis with Baker's cyst   Right knee osteoarthritis with a Baker's cyst is causing significant pain and mobility issues. Imaging indicates mild to moderate osteoarthritis. The Baker's cyst, secondary to arthritis, is expected to be self-limiting. After discussion of treatment options, an aspiration was offered and performed. 30 mL of clear synovial fluid was removed followed by a cortisone injection. Advise continued movement to prevent stiffness and worsening of arthritis. RICE therapy also recommended for pain and swelling control.       Procedure Note  Patient: Taylor Morales              Date of Birth: 07-22-1956           MRN: [id number]             Visit Date: 08/24/2024  Procedures: Visit Diagnoses:  1. Unilateral primary osteoarthritis, right knee     Large Joint Inj: R knee on 08/24/2024 5:25 PM Indications: pain Details: 18 G  1.5 in needle, superolateral approach Medications: 4 mL lidocaine 1 %; 2 mL triamcinolone acetonide 40 MG/ML Aspirate: 30 mL clear Outcome: tolerated well, no immediate complications Procedure, treatment alternatives, risks and benefits explained, specific risks discussed. Consent was given by the patient. Immediately prior to procedure a time out was called to verify the correct patient, procedure, equipment, support staff and site/side marked as required. Patient was prepped and draped in the usual sterile fashion.       I personally saw and evaluated the patient, and participated in the management and treatment plan.  Leonce Reveal, PA-C Orthopedics

## 2024-08-26 MED ORDER — TRIAMCINOLONE ACETONIDE 40 MG/ML IJ SUSP
2.0000 mL | INTRAMUSCULAR | Status: AC | PRN
  Administered 2024-08-24: 2 mL via INTRA_ARTICULAR

## 2024-08-26 MED ORDER — LIDOCAINE HCL 1 % IJ SOLN
4.0000 mL | INTRAMUSCULAR | Status: AC | PRN
  Administered 2024-08-24: 4 mL

## 2024-10-01 ENCOUNTER — Encounter: Payer: Self-pay | Admitting: Radiology

## 2024-10-07 NOTE — Patient Instructions (Signed)
 It was good to see you today, I will be in touch with your labs

## 2024-10-07 NOTE — Progress Notes (Unsigned)
 [facility] [address], [address] [place], [place] [address] [phone]  Date:  10/10/2024   Name:  Taylor Morales   DOB:  04-Jan-1956   MRN:  [id number]  PCP:  Watt Harlene BROCKS, MD    Chief Complaint: No chief complaint on file.   History of Present Illness:  Taylor Morales is a 68 y.o. very pleasant female patient who presents with the following:  Patient seen today for physical exam.  I saw her most recently about 1 year ago History of prediabetes, obesity, hyperlipidemia, hypertension, alcohol abuse  Colon cancer screening is due for 5-year recheck Flu shot Mammogram is up-to-date Bone density was done 12/23 She has completed Shingrix Can update labs today  Amlodipine  5 Losartan /hydrochlorothiazide  Fluoxetine   Patient Active Problem List   Diagnosis Date Noted   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/08/2020   Pre-diabetes 11/02/2016   Alcoholism (HCC) 04/17/2015   Former smoker 04/17/2015   Obesity 02/27/2010   Hyperlipidemia 09/29/2007   Depression 09/29/2007   Essential hypertension 09/29/2007   Allergic rhinitis 09/29/2007    Past Medical History:  Diagnosis Date   Allergy    rhinitis   Depression    stress related in past, on prozac -took self off   Hyperlipidemia    Hypertension     Past Surgical History:  Procedure Laterality Date   BREAST LUMPECTOMY     benign   CESAREAN SECTION     x2   NASAL SINUS SURGERY     x2   TONSILLECTOMY     as child    Social History   Tobacco Use   Smoking status: Former    Current packs/day: 0.00    Average packs/day: 1 pack/day for 13.0 years (13.0 ttl pk-yrs)    Types: Cigarettes    Start date: 11/29/1969    Quit date: 11/29/1982    Years since quitting: 41.8   Smokeless tobacco: Current  Vaping Use   Vaping status: Never Used  Substance Use Topics   Alcohol use: Yes    Comment: daily   Drug use: No    Family History  Problem Relation Age of  Onset   Lymphoma Mother        around 69   Breast cancer Mother        age 89   Pancreatic cancer Mother        ? pancreatic as had whipple   Other Father        unknown   Lymphoma Maternal Uncle     No Known Allergies  Medication list has been reviewed and updated.  Current Outpatient Medications on File Prior to Visit  Medication Sig Dispense Refill   acetaminophen  (TYLENOL ) 500 MG tablet Take 500 mg by mouth every 6 (six) hours as needed.     amLODipine  (NORVASC ) 5 MG tablet Take 1 tablet (5 mg total) by mouth daily. 90 tablet 3   FLUoxetine  (PROZAC ) 40 MG capsule Take 1 capsule (40 mg total) by mouth daily. 90 capsule 3   Krill Oil Omega-3 500 MG CAPS Take 500 mg by mouth daily.     losartan -hydrochlorothiazide  (HYZAAR) 100-25 MG tablet Take 1 tablet by mouth daily. 90 tablet 3   Multiple Vitamin (MULTIVITAMIN WITH MINERALS) TABS tablet Take 1 tablet by mouth daily. Centrum     oxyCODONE  (ROXICODONE ) 5 MG immediate release tablet Take 1 tablet (5 mg total) by mouth every 4 (four) hours  as needed. 10 tablet 0   oxymetazoline (AFRIN) 0.05 % nasal spray Place 1 spray into both nostrils daily as needed for congestion.     predniSONE  (DELTASONE ) 20 MG tablet Take 40 mg by mouth daily for 4 days, then 20 mg by mouth daily for 4 days 12 tablet 0   TURMERIC PO Take 1 tablet by mouth daily.     valACYclovir  (VALTREX ) 500 MG tablet Take 1 tablet (500 mg total) by mouth 2 (two) times daily. Take twice a day for 3 days as needed for outbreak 30 tablet 3   [DISCONTINUED] fluticasone  (FLONASE ) 50 MCG/ACT nasal spray Place 2 sprays into both nostrils daily. (Patient not taking: Reported on 02/29/2020) 16 g 12   [DISCONTINUED] fluticasone  (VERAMYST) 27.5 MCG/SPRAY nasal spray Place 1 spray into the nose 2 (two) times daily. 30 g 3   No current facility-administered medications on file prior to visit.    Review of Systems:  ***  Physical Examination: There were no vitals filed for this  visit. There were no vitals filed for this visit. There is no height or weight on file to calculate BMI. Ideal Body Weight:    ***  Assessment and Plan: No diagnosis found.  Assessment & Plan   Signed Harlene Schroeder, MD

## 2024-10-10 ENCOUNTER — Encounter: Payer: Self-pay | Admitting: Family Medicine

## 2024-10-10 ENCOUNTER — Ambulatory Visit: Admitting: Family Medicine

## 2024-10-10 ENCOUNTER — Other Ambulatory Visit (HOSPITAL_BASED_OUTPATIENT_CLINIC_OR_DEPARTMENT_OTHER): Payer: Self-pay

## 2024-10-10 VITALS — BP 124/80 | HR 91 | Ht 68.0 in | Wt 244.5 lb

## 2024-10-10 DIAGNOSIS — Z1329 Encounter for screening for other suspected endocrine disorder: Secondary | ICD-10-CM

## 2024-10-10 DIAGNOSIS — R7303 Prediabetes: Secondary | ICD-10-CM

## 2024-10-10 DIAGNOSIS — I10 Essential (primary) hypertension: Secondary | ICD-10-CM

## 2024-10-10 DIAGNOSIS — Z23 Encounter for immunization: Secondary | ICD-10-CM

## 2024-10-10 DIAGNOSIS — Z0000 Encounter for general adult medical examination without abnormal findings: Secondary | ICD-10-CM | POA: Diagnosis not present

## 2024-10-10 DIAGNOSIS — F331 Major depressive disorder, recurrent, moderate: Secondary | ICD-10-CM | POA: Diagnosis not present

## 2024-10-10 DIAGNOSIS — E2839 Other primary ovarian failure: Secondary | ICD-10-CM | POA: Diagnosis not present

## 2024-10-10 DIAGNOSIS — E785 Hyperlipidemia, unspecified: Secondary | ICD-10-CM

## 2024-10-10 LAB — COMPREHENSIVE METABOLIC PANEL WITH GFR
ALT: 21 U/L (ref 0–35)
AST: 16 U/L (ref 0–37)
Albumin: 4.4 g/dL (ref 3.5–5.2)
Alkaline Phosphatase: 74 U/L (ref 39–117)
BUN: 14 mg/dL (ref 6–23)
CO2: 28 meq/L (ref 19–32)
Calcium: 9.4 mg/dL (ref 8.4–10.5)
Chloride: 99 meq/L (ref 96–112)
Creatinine, Ser: 0.83 mg/dL (ref 0.40–1.20)
GFR: 72.44 mL/min (ref >60.00)
Glucose, Bld: 85 mg/dL (ref 70–99)
Potassium: 4.5 meq/L (ref 3.5–5.1)
Sodium: 135 meq/L (ref 135–145)
Total Bilirubin: 0.5 mg/dL (ref 0.2–1.2)
Total Protein: 7 g/dL (ref 6.0–8.3)

## 2024-10-10 LAB — LIPID PANEL
Cholesterol: 241 mg/dL — ABNORMAL HIGH (ref 0–200)
HDL: 98.2 mg/dL (ref >39.00)
LDL Cholesterol: 126 mg/dL — ABNORMAL HIGH (ref 0–99)
NonHDL: 142.91
Total CHOL/HDL Ratio: 2
Triglycerides: 84 mg/dL (ref 0.0–149.0)
VLDL: 16.8 mg/dL (ref 0.0–40.0)

## 2024-10-10 LAB — CBC
HCT: 38.7 % (ref 36.0–46.0)
Hemoglobin: 13 g/dL (ref 12.0–15.0)
MCHC: 33.6 g/dL (ref 30.0–36.0)
MCV: 91.7 fl (ref 78.0–100.0)
Platelets: 450 K/uL — ABNORMAL HIGH (ref 150.0–400.0)
RBC: 4.23 Mil/uL (ref 3.87–5.11)
RDW: 12.7 % (ref 11.5–15.5)
WBC: 7.2 K/uL (ref 4.0–10.5)

## 2024-10-10 LAB — HEMOGLOBIN A1C: Hgb A1c MFr Bld: 6.7 % — ABNORMAL HIGH (ref 4.6–6.5)

## 2024-10-10 LAB — TSH: TSH: 2.27 u[IU]/mL (ref 0.35–5.50)

## 2024-10-10 MED ORDER — FLUOXETINE HCL 40 MG PO CAPS: 40.0000 mg | ORAL_CAPSULE | Freq: Every day | ORAL | 3 refills | Status: AC

## 2024-10-10 MED ORDER — AMLODIPINE BESYLATE 5 MG PO TABS: 5.0000 mg | ORAL_TABLET | Freq: Every day | ORAL | 3 refills | Status: AC

## 2024-10-10 MED ORDER — LOSARTAN POTASSIUM-HCTZ 100-25 MG PO TABS: 1.0000 | ORAL_TABLET | Freq: Every day | ORAL | 3 refills | Status: AC

## 2024-10-10 MED ORDER — COMIRNATY 30 MCG/0.3ML IM SUSY
0.3000 mL | PREFILLED_SYRINGE | Freq: Once | INTRAMUSCULAR | 0 refills | Status: AC
  Filled 2024-10-10: qty 0.3, 1d supply, fill #0

## 2024-10-10 NOTE — Addendum Note (Signed)
 Addended by: Yug Loria M on: 10/10/2024 04:24 PM   Modules accepted: Orders

## 2024-11-05 ENCOUNTER — Encounter: Payer: Self-pay | Admitting: Family Medicine

## 2024-11-08 LAB — HM MAMMOGRAPHY

## 2024-11-12 ENCOUNTER — Encounter: Payer: Self-pay | Admitting: Family Medicine
